# Patient Record
Sex: Male | Born: 1945 | Race: White | Hispanic: No | Marital: Married | Smoking: Current every day smoker
Health system: Southern US, Community
[De-identification: ages and names within clinical notes are randomized; demographics above are authoritative.]

## PROBLEM LIST (undated history)

## (undated) DIAGNOSIS — I10 Essential (primary) hypertension: Secondary | ICD-10-CM

## (undated) DIAGNOSIS — I2510 Atherosclerotic heart disease of native coronary artery without angina pectoris: Secondary | ICD-10-CM

## (undated) DIAGNOSIS — F419 Anxiety disorder, unspecified: Secondary | ICD-10-CM

## (undated) DIAGNOSIS — C329 Malignant neoplasm of larynx, unspecified: Secondary | ICD-10-CM

## (undated) DIAGNOSIS — F32A Depression, unspecified: Secondary | ICD-10-CM

## (undated) DIAGNOSIS — J45909 Unspecified asthma, uncomplicated: Secondary | ICD-10-CM

## (undated) DIAGNOSIS — F329 Major depressive disorder, single episode, unspecified: Secondary | ICD-10-CM

## (undated) DIAGNOSIS — I639 Cerebral infarction, unspecified: Secondary | ICD-10-CM

## (undated) DIAGNOSIS — C300 Malignant neoplasm of nasal cavity: Secondary | ICD-10-CM

## (undated) DIAGNOSIS — M1990 Unspecified osteoarthritis, unspecified site: Secondary | ICD-10-CM

## (undated) DIAGNOSIS — Z85528 Personal history of other malignant neoplasm of kidney: Secondary | ICD-10-CM

## (undated) DIAGNOSIS — R0600 Dyspnea, unspecified: Secondary | ICD-10-CM

## (undated) HISTORY — DX: Personal history of other malignant neoplasm of kidney: Z85.528

## (undated) HISTORY — PX: EYE SURGERY: SHX253

## (undated) HISTORY — PX: CORONARY ANGIOPLASTY: SHX604

## (undated) HISTORY — PX: KIDNEY SURGERY: SHX687

## (undated) HISTORY — DX: Essential (primary) hypertension: I10

## (undated) HISTORY — DX: Malignant neoplasm of nasal cavity: C30.0

## (undated) HISTORY — PX: OTHER SURGICAL HISTORY: SHX169

## (undated) HISTORY — PX: CARDIAC CATHETERIZATION: SHX172

## (undated) HISTORY — DX: Malignant neoplasm of larynx, unspecified: C32.9

## (undated) HISTORY — DX: Unspecified osteoarthritis, unspecified site: M19.90

---

## 2016-03-24 ENCOUNTER — Encounter: Payer: Self-pay | Admitting: Surgery

## 2016-03-31 ENCOUNTER — Encounter: Payer: Self-pay | Admitting: Surgery

## 2016-03-31 ENCOUNTER — Other Ambulatory Visit: Payer: Self-pay | Admitting: *Deleted

## 2016-03-31 ENCOUNTER — Encounter: Payer: Self-pay | Admitting: *Deleted

## 2016-03-31 ENCOUNTER — Institutional Professional Consult (permissible substitution) (INDEPENDENT_AMBULATORY_CARE_PROVIDER_SITE_OTHER): Payer: Medicare Other | Admitting: Surgery

## 2016-03-31 VITALS — BP 140/96 | HR 65 | Resp 18 | Ht 74.0 in

## 2016-03-31 DIAGNOSIS — R918 Other nonspecific abnormal finding of lung field: Secondary | ICD-10-CM

## 2016-04-01 ENCOUNTER — Encounter: Payer: Self-pay | Admitting: *Deleted

## 2016-04-01 ENCOUNTER — Other Ambulatory Visit: Payer: Self-pay

## 2016-04-01 ENCOUNTER — Encounter: Payer: Self-pay | Admitting: Surgery

## 2016-04-01 NOTE — Progress Notes (Signed)
Cardiothoracic Surgery Consultation  PCP is Claudia Pollock, MD Referring Provider is Gardiner Rhyme, MD  Chief Complaint  Patient presents with  . 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 He was evaluated by Dr. Novella Rob from [facility] in [place] in early August for complaints of exertional shortness of breath, palpitations and dizziness. A stress test was abnormal and plans were apparently made for cardiac cath which has not been done.  The stress test reportedly showed inferoapical ischemia and reduced EF.He had a chest x-ray as part of his workup  showing a LUL lung mass. A CT scan performed at [facility] in August which showed a 3 cm tumor in the apical portion of the LUL adjacent to the mediastinum and left subclavian artery that was suspicious for bronchogenic carcinoma. There was also an enlarged pre-vascular lymph node and fullness in the AP window, although no contrast was given to highlight the vasculature. He had a PET scan done in [place] on 01/29/2016 which showed the 25 x 28 mm LUL mass to be hypermetabolic with an SUV of 40.9. The enlarged prevascular ( SUV 5.9)  and AP window ( SUV 7.2) lymph nodes were hypermetabolic. There was also hypermetabolic activity in the left ear and a hypermetabolic soft tissue nodule or lymph node within the left parotid gland. 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  He underwent bronchoscopy and biopsy by Dr. Alcide Clever that was inconclusive.   The patient still smokes about half a pack of cigarettes daily. He lives at home with his wife who is here today. He reports  exertional dyspnea. He has chronic mucous production and cough since his laryngeal radiation. He denies hemoptysis. He has had reduced appetite and weight loss.   PMH:   Hypertension  Thyroid disease  Stroke    PSH:  Abdominal aortic aneurysm diagnosed in 2011 and followed when he lived at the [facility].  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  Social History Social History  Substance Use Topics  . Smoking status: Current Every Day Smoker    Packs/day: 0.50    Years: 30.00    Types: Cigarettes  . Smokeless tobacco: Never Used  . Alcohol use Yes     Comment: OCCASIONAL  [place] veteran with agent orange exposure  Current Outpatient Prescriptions  Medication Sig Dispense Refill  . amLODipine (NORVASC) 5 MG tablet Take 5 mg by mouth daily.    Marland Kitchen aspirin EC 81 MG tablet Take 81 mg by mouth daily.    . bisoprolol (ZEBETA) 5 MG tablet Take 5 mg by mouth daily.    . Fluticasone Furoate-Vilanterol (BREO ELLIPTA IN) Inhale into the lungs.     No current facility-administered medications for this visit.     Not on File  Review of Systems  Constitutional: Positive for appetite change and unexpected weight  change. Negative for chills, fatigue and fever.  HENT: Positive for voice change.        Since treatment of his laryngeal cancer.  Eyes: Negative.   Respiratory: Positive for cough and shortness of breath.   Cardiovascular: Positive for palpitations. Negative for chest pain and leg swelling.  Gastrointestinal: Negative.   Endocrine: Negative.   Genitourinary: Negative.   Skin: Negative.   Allergic/Immunologic: Negative.   Neurological: Positive for dizziness.  Hematological: Negative.   Psychiatric/Behavioral: Negative.     BP (!) 140/96 (BP  Location: Right Arm, Patient Position: Sitting, Cuff Size: Large)   Pulse 65   Resp 18   Ht '6\' 2"'$  (1.88 m)   SpO2 92% Comment: ON RA Physical Exam  Constitutional: He is oriented to person, place, and time.  Chronically ill-appearing gentleman in no distress  Hoarse voice that he says is chronic  HENT:  Head: Normocephalic and atraumatic.  Mouth/Throat: Oropharynx is clear and moist.  Edentulous  Deformed left ear from prior resection. The left external ear is firm and thickened and likely has recurrent squamous cell cancer.  Eyes: EOM are normal. Pupils are equal, round, and reactive to light.  Neck: Normal range of motion. Neck supple. No JVD present. No thyromegaly present.  Cardiovascular: Normal rate, regular rhythm, normal heart sounds and intact distal pulses.   No murmur heard. Pulmonary/Chest: Effort normal. No respiratory distress.  Bilateral rhonchi  Abdominal: Soft. Bowel sounds are normal. He exhibits no distension and no mass. There is no tenderness.  Musculoskeletal: Normal range of motion. He exhibits no edema or tenderness.  Lymphadenopathy:    He has no cervical adenopathy.  Neurological: He is alert and oriented to person, place, and time. He has normal strength. No cranial nerve deficit or sensory deficit.  Skin: Skin is warm and dry.  Psychiatric: He has a normal mood and affect.     Diagnostic Tests:  I have personally reviewed and interpreted his CT scan from [facility] on PACS and his PET scan from [facility] on disc. Findings are as noted above. I reviewed the images with him and his wife and answered their questions.  Impression:  This 70 year old smoker has had multiple cancers in the past and now has a hypermetabolic 2.8 cm mass in the apical LUL consistent with bronchogenic carcinoma. He also has hypermetabolic mediastinal lymph nodes in the prevascular and AP window regions. He also most likely has recurrent squamous cell cancer of the left  external ear and a hypermetabolic soft tissue nodule in the left parotid gland. He has a 7 cm infrarenal AAA. His stress test done in August for exertional shortness of breath and palpitations with dizziness was abnormal and a cath was planned for further workup but delayed due to the finding of this lung mass. His PFT's showed a mild obstructive defect but severe diffusion defect. He has been losing weight with poor appetite. I don't think he is a candidate for surgical resection of the lung mass since he most likely has stage 3A disease and has multiple other comorbid conditions as noted above that would increase his risk of surgery. There is no survival benefit for surgical resection of stage 3A disease. The goal should be obtaining a tissue diagnosis and then referral for consideration of chemo and XRT. I think a CT guided needle biopsy of the LUL lesion may be the best way to get a diagnosis. The location of this lesion would make ENB difficult and lower yield.  The prevascular and AP window lymphadenopathy are not accessible by mediastinoscopy and the AP window may not be accessible with EBUS.   He also has a large AAA which will require evaluation by general vascular surgery for endovascular repair although the presence of stage 3A lung cancer complicates that decision. He may also have significant coronary artery disease that has not been completely evaluated yet.    Plan:  I will have him evaluated by interventional radiology for consideration of a CT-guided biopsy of the LUL lung mass and then will see him back to discuss results and make further plans. I have discussed my impression and recommendations/plan in detail with the patient and his wife and all of their questions have been answered. Gaye Pollack, MD [facility] [phone]

## 2016-04-02 ENCOUNTER — Encounter: Payer: Self-pay | Admitting: Surgery

## 2016-04-02 NOTE — Progress Notes (Signed)
IR sent me a staff message and did not feel that there was a safe window for CT guided needle biopsy of the LUL lung mass since it is medial and just superior to the aorta, adjacent to the LSCA. I reviewed the CT with one of my partners and feel that ENB may be the next best way to biopsy this lung lesion although it is in a difficult location and ENB may only have a 60% chance of getting a diagnosis. The AP window lymphadenopathy may be accessible by EBUS biopsy. We will get a Super Dimension CT of the chest and have him return to see Dr. Roxan Hockey to evaluate for ENB, possible EBUS. I discussed this by phone with the patient's wife and she is in agreement.

## 2016-04-05 ENCOUNTER — Other Ambulatory Visit: Payer: Self-pay | Admitting: *Deleted

## 2016-04-05 DIAGNOSIS — R918 Other nonspecific abnormal finding of lung field: Secondary | ICD-10-CM

## 2016-04-09 ENCOUNTER — Ambulatory Visit
Admission: RE | Admit: 2016-04-09 | Discharge: 2016-04-09 | Disposition: A | Discharge status: Home / Self Care | Payer: Medicare Other | Source: Ambulatory Visit | Attending: Surgery | Admitting: Surgery

## 2016-04-09 ENCOUNTER — Encounter: Payer: Self-pay | Admitting: Thoracic Surgery (Cardiothoracic Vascular Surgery)

## 2016-04-09 ENCOUNTER — Other Ambulatory Visit: Payer: Self-pay

## 2016-04-09 ENCOUNTER — Institutional Professional Consult (permissible substitution) (INDEPENDENT_AMBULATORY_CARE_PROVIDER_SITE_OTHER): Payer: Medicare Other | Admitting: Thoracic Surgery (Cardiothoracic Vascular Surgery)

## 2016-04-09 VITALS — BP 159/95 | HR 78 | Resp 20 | Ht 74.0 in | Wt 176.0 lb

## 2016-04-09 DIAGNOSIS — R911 Solitary pulmonary nodule: Secondary | ICD-10-CM

## 2016-04-09 DIAGNOSIS — R918 Other nonspecific abnormal finding of lung field: Secondary | ICD-10-CM

## 2016-04-09 DIAGNOSIS — R599 Enlarged lymph nodes, unspecified: Secondary | ICD-10-CM

## 2016-04-09 DIAGNOSIS — R591 Generalized enlarged lymph nodes: Secondary | ICD-10-CM

## 2016-04-09 NOTE — Progress Notes (Signed)
Dr. Kalman Shan reviewed Dr. Leonarda Salon progress note from today; MD advised that it is okay for pt to continue Aspirin and hold on DOS.

## 2016-04-09 NOTE — Progress Notes (Signed)
Pre-op instructions given only; please complete pt assessment on DOS. Pt made aware to stop taking vitamins, fish oil and herbal medications. Do not take any NSAIDs ie: Ibuprofen, Advil, Naproxen, BC and Goody Powder. Pt verbalized understanding of all pre-op instructions.

## 2016-04-09 NOTE — Progress Notes (Signed)
PCP is Claudia Pollock, MD Referring Provider is Gaye Pollack, MD  Chief Complaint  Patient presents with  . 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   With complaints of exertional shortness of breath, palpitations, and dizziness. He had an abnormal stress test and plans are being made for cardiac catheterization. He had either a chest x-ray or cardiac CT, it is unclear from the chart. That showed a 3 cm left upper lobe mass with likely mediastinal invasion suspicious for bronchogenic carcinoma. A PET CT was done in [place] which showed a 25 x 28 mm left upper lobe mass which was hypermetabolic with an SUV of 35.5. There was an AP window lymph node with SUV of 7.2. In the left hilar node with an SUV of 11.6. There also was activity in the left ear and left parotid gland. He also was noted to have a 7 cm infrarenal abdominal aneurysm and left common iliac artery aneurysm. He underwent bronchoscopy and biopsy by Dr. Alcide Clever which was inconclusive.  Dr. Cyndia Bent saw the patient and referred for CT-guided biopsy, but interventional radiology would not do the procedure due to risk. He now returns to discuss the possibility of navigational bronchoscopy.  Past Medical History:  Diagnosis Date  . Arthritis   . 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 Hypertension   . Laryngeal cancer ([place])   . Personal history of kidney cancer     Past Surgical History:  Procedure Laterality Date  . ankle surgery    . feet surgery    . s/p chemo/radiation 2013      Family History  Problem Relation Age of Onset  . Cancer Mother     Social History Social History  Substance Use Topics  . Smoking status: Current Every Day Smoker    Packs/day: 0.50     Years: 30.00    Types: Cigarettes  . Smokeless tobacco: Never Used  . Alcohol use Yes     Comment: OCCASIONAL    Current Outpatient Prescriptions  Medication Sig Dispense Refill  . amLODipine (NORVASC) 5 MG tablet Take 5 mg by mouth daily.    Marland Kitchen aspirin EC 81 MG tablet Take 81 mg by mouth daily.    . bisoprolol (ZEBETA) 5 MG tablet Take 5 mg by mouth daily.    Marland Kitchen buPROPion (WELLBUTRIN XL) 150 MG 24 hr tablet Take 150 mg by mouth daily.    . fluticasone (FLONASE) 50 MCG/ACT nasal spray Place 1 spray into both nostrils daily.    . Fluticasone Furoate-Vilanterol (BREO ELLIPTA IN) Inhale into the lungs.    . latanoprost (XALATAN) 0.005 % ophthalmic solution 1 drop at bedtime.    . Levothyroxine Sodium (TIROSINT) 137 MCG CAPS Take 1 capsule by mouth daily before breakfast.    . olmesartan-hydrochlorothiazide (BENICAR HCT) 40-25 MG tablet Take 1 tablet by mouth daily.    . pantoprazole (PROTONIX) 40 MG tablet Take 40 mg by mouth daily.    Marland Kitchen tiZANidine (ZANAFLEX) 4 MG tablet Take 4 mg by mouth every 8 (eight) hours as needed for muscle spasms.     No current facility-administered medications for this visit.     Allergies  Allergen Reactions  . Coconut Oil Shortness Of Breath  Review of Systems  Constitutional: Positive for fatigue and unexpected weight change (15 pound weight loss last 3 months). Negative for activity change.  HENT: Positive for voice change (Hoarse since radiation for laryngeal cancer).   Eyes: Negative for visual disturbance.  Respiratory: Positive for cough, shortness of breath and wheezing.   Cardiovascular: Negative for chest pain and leg swelling.  Gastrointestinal: Negative for abdominal pain and blood in stool.  Genitourinary: Negative for dysuria and hematuria.  Musculoskeletal: Positive for arthralgias and joint swelling.  Neurological: Positive for dizziness and speech difficulty.       History of stroke  Hematological: Negative for adenopathy.  Bruises/bleeds easily.  All other systems reviewed and are negative.   BP (!) 159/95 (BP Location: Left Arm, Cuff Size: Normal)   Pulse 78   Resp 20   Ht '6\' 2"'$  (1.88 m)   Wt 176 lb (79.8 kg)   SpO2 98% Comment: RA  BMI 22.60 kg/m  Physical Exam  Constitutional: He is oriented to person, place, and time. No distress.  70 year old man appears older than stated age  HENT:  Head: Normocephalic and atraumatic.  Mouth/Throat: No oropharyngeal exudate.  Hoarse voice, deformity left ear  Eyes: Conjunctivae and EOM are normal. No scleral icterus.  Neck:  Radiation changes to skin. Well-healed surgical scar  Cardiovascular: Normal rate, regular rhythm and normal heart sounds.   No murmur heard. Pulmonary/Chest: Effort normal and breath sounds normal. No respiratory distress. He has no wheezes. He has no rales.  Abdominal: Soft. He exhibits no distension. There is no tenderness.  Musculoskeletal: Normal range of motion. He exhibits no edema.  Lymphadenopathy:    He has no cervical adenopathy.  Neurological: He is alert and oriented to person, place, and time. No cranial nerve deficit.  Skin: Skin is warm and dry.  Vitals reviewed.    Diagnostic Tests: CT CHEST WITHOUT CONTRAST  TECHNIQUE: Multidetector CT imaging of the chest was performed using thin slice collimation for electromagnetic bronchoscopy planning purposes, without intravenous contrast.  COMPARISON:  CT scan 01/22/2016  FINDINGS: Chest wall: No chest wall mass, supraclavicular or axillary lymphadenopathy. Mild bilateral gynecomastia.  Cardiovascular: The heart is normal in size. Stable tortuosity, ectasia and calcification of the thoracic aorta. Stable fusiform aneurysmal dilatation of the ascending aorta measuring a maximum of 4.3 cm on image number 80 at the level of the right main pulmonary artery. There is also fusiform enlargement of the descending thoracic aorta with maximum measurement of 3.8 cm on  image number 85. Stable dense 3 vessel coronary artery calcifications.  The pulmonary arteries are slightly enlarged which may suggest pulmonary hypertension.  Mediastinum/Nodes: Small scattered mediastinal and hilar lymph nodes but no mass or overt adenopathy. The prevascular lymph node on image number 62 measures 11 mm and is stable. The esophagus is grossly normal.  Lungs/Pleura: Slight interval enlargement of the left apical lung mass. It measures 33 x 30 mm on image number 36 and previously measured 26 x 25 mm. Stable underlying emphysematous changes. No worrisome pulmonary nodules. No infiltrates or effusions.  Upper Abdomen: Stable atherosclerotic calcifications involving the abdominal aorta and branch vessels. No findings to suggest hepatic or adrenal gland metastasis.  Musculoskeletal: No significant bony findings. No evidence of osseous metastatic disease.  IMPRESSION: 1. Slight interval enlargement of the left apical lung mass now measuring 33 x 30 mm. 2. Stable small scattered mediastinal and hilar lymph nodes but no overt adenopathy. 3. Stable emphysematous changes. No acute pulmonary findings or worrisome  pulmonary nodules. 4. Stable fusiform aneurysmal dilatation of the ascending and descending thoracic aorta. Recommend annual imaging followup by CTA or MRA. This recommendation follows 2010 ACCF/AHA/AATS/ACR/ASA/SCA/SCAI/SIR/STS/SVM Guidelines for the Diagnosis and Management of Patients with Thoracic Aortic Disease. Circulation. 2010; 121: D782-U235   Electronically Signed   By: Marijo Sanes M.D.   On: 04/09/2016 15:56  I personally reviewed the CT chest and his outside PET images. I concur with the findings noted above.  The PET shows a hypermetabolic lung mass and hypermetabolic AP window and left hilar nodes  Impression: 70 year old man with multiple life-threatening medical problems. He hasn't talked complicated history with previous  squamous cell carcinoma of the ear with a likely recurrence. He also has laryngeal cancer treated with chemotherapy and radiation in the past. He has chronic hoarseness. He is now been found to have significant coronary artery disease although it has not been completely evaluated. He has a newly discovered 7 cm infrarenal abdominal aortic aneurysm. In addition to that he has a left upper lobe mass with AP window and hilar adenopathy. This is suspicious for stage IIIa non-small cell lung cancer. Unfortunately initial attempts at biopsy were nondiagnostic.  He had a super dimension CT scan done today which showed the left apical mass is increased in size.  This is a very difficult tumor to access it very medial in the upper lobe adjacent to the mediastinum and its very distal. I think the nodule possibly can be accessed with a navigational bronchoscopy, but there certainly is no guarantee that we'll be able to make a definitive diagnosis. I wouldn't that the odds of a diagnostic study around 60-70%. In addition the left hilar node that runs along the superior aspect of the left mainstem bronchus may be visible and accessible by endobronchial ultrasound although again I would put that at no higher than the 60-70% chance. Given that, navigational bronchoscopy and endobronchial ultrasound would  avoid an operation. I think that is important, since he has multiple other serious medical problems need to be addressed including a 7 cm aneurysm. Therefore, I think is reasonable to try ENB and EBUS as the next step.  I discussed the general nature of the procedure with Mr. and Mrs. Kilgour. They understand this is an endoscopic procedure, but would be done in the operating room under general anesthesia. They understand that no guarantee can be given that we will be able to make a diagnosis. I informed him of the indications, risks, benefits, and alternatives. They understand the risks of bleeding, pneumothorax, MI,  DVT, PE, death, as well as possibility of other unforeseeable complications.  He accepts the risks and wishes to proceed.  Plan: Navigational bronchoscopy and endobronchial ultrasound on Monday, 04/12/2016.  [name], MD [facility] [phone]

## 2016-04-12 ENCOUNTER — Ambulatory Visit (HOSPITAL_COMMUNITY): Payer: Medicare Other

## 2016-04-12 ENCOUNTER — Encounter (HOSPITAL_COMMUNITY)
Admission: RE | Disposition: A | Discharge status: Home / Self Care | Payer: Self-pay | Source: Ambulatory Visit | Attending: Thoracic Surgery (Cardiothoracic Vascular Surgery)

## 2016-04-12 ENCOUNTER — Encounter (HOSPITAL_COMMUNITY): Payer: Self-pay | Admitting: Anesthesiology

## 2016-04-12 ENCOUNTER — Ambulatory Visit (HOSPITAL_COMMUNITY): Payer: Medicare Other | Admitting: Certified Registered"

## 2016-04-12 ENCOUNTER — Ambulatory Visit (HOSPITAL_COMMUNITY)
Admission: RE | Admit: 2016-04-12 | Discharge: 2016-04-12 | Disposition: A | Discharge status: Home / Self Care | Payer: Medicare Other | Source: Ambulatory Visit | Attending: Thoracic Surgery (Cardiothoracic Vascular Surgery) | Admitting: Thoracic Surgery (Cardiothoracic Vascular Surgery)

## 2016-04-12 DIAGNOSIS — Z91048 Other nonmedicinal substance allergy status: Secondary | ICD-10-CM | POA: Insufficient documentation

## 2016-04-12 DIAGNOSIS — F17210 Nicotine dependence, cigarettes, uncomplicated: Secondary | ICD-10-CM | POA: Diagnosis not present

## 2016-04-12 DIAGNOSIS — E039 Hypothyroidism, unspecified: Secondary | ICD-10-CM | POA: Insufficient documentation

## 2016-04-12 DIAGNOSIS — R591 Generalized enlarged lymph nodes: Secondary | ICD-10-CM

## 2016-04-12 DIAGNOSIS — Z9889 Other specified postprocedural states: Secondary | ICD-10-CM

## 2016-04-12 DIAGNOSIS — I10 Essential (primary) hypertension: Secondary | ICD-10-CM | POA: Insufficient documentation

## 2016-04-12 DIAGNOSIS — C3412 Malignant neoplasm of upper lobe, left bronchus or lung: Secondary | ICD-10-CM | POA: Insufficient documentation

## 2016-04-12 DIAGNOSIS — R222 Localized swelling, mass and lump, trunk: Secondary | ICD-10-CM

## 2016-04-12 DIAGNOSIS — R918 Other nonspecific abnormal finding of lung field: Secondary | ICD-10-CM | POA: Diagnosis present

## 2016-04-12 DIAGNOSIS — Z01811 Encounter for preprocedural respiratory examination: Secondary | ICD-10-CM

## 2016-04-12 DIAGNOSIS — Z7982 Long term (current) use of aspirin: Secondary | ICD-10-CM | POA: Diagnosis not present

## 2016-04-12 DIAGNOSIS — R911 Solitary pulmonary nodule: Secondary | ICD-10-CM

## 2016-04-12 DIAGNOSIS — K219 Gastro-esophageal reflux disease without esophagitis: Secondary | ICD-10-CM | POA: Insufficient documentation

## 2016-04-12 DIAGNOSIS — I2510 Atherosclerotic heart disease of native coronary artery without angina pectoris: Secondary | ICD-10-CM | POA: Insufficient documentation

## 2016-04-12 DIAGNOSIS — M1990 Unspecified osteoarthritis, unspecified site: Secondary | ICD-10-CM | POA: Diagnosis not present

## 2016-04-12 DIAGNOSIS — R599 Enlarged lymph nodes, unspecified: Secondary | ICD-10-CM

## 2016-04-12 DIAGNOSIS — R590 Localized enlarged lymph nodes: Secondary | ICD-10-CM | POA: Diagnosis not present

## 2016-04-12 HISTORY — PX: VIDEO BRONCHOSCOPY WITH ENDOBRONCHIAL ULTRASOUND: SHX6177

## 2016-04-12 HISTORY — PX: VIDEO BRONCHOSCOPY WITH ENDOBRONCHIAL NAVIGATION: SHX6175

## 2016-04-12 LAB — CBC
HEMATOCRIT: 36.3 % — AB (ref 39.0–52.0)
HEMOGLOBIN: 11.5 g/dL — AB (ref 13.0–17.0)
MCH: 28.8 pg (ref 26.0–34.0)
MCHC: 31.7 g/dL (ref 30.0–36.0)
MCV: 91 fL (ref 78.0–100.0)
Platelets: 262 10*3/uL (ref 150–400)
RBC: 3.99 MIL/uL — AB (ref 4.22–5.81)
RDW: 16.5 % — ABNORMAL HIGH (ref 11.5–15.5)
WBC: 5.5 10*3/uL (ref 4.0–10.5)

## 2016-04-12 LAB — COMPREHENSIVE METABOLIC PANEL
ALBUMIN: 3 g/dL — AB (ref 3.5–5.0)
ALK PHOS: 84 U/L (ref 38–126)
ALT: 10 U/L — AB (ref 17–63)
AST: 15 U/L (ref 15–41)
Anion gap: 9 (ref 5–15)
BILIRUBIN TOTAL: 0.5 mg/dL (ref 0.3–1.2)
BUN: 22 mg/dL — AB (ref 6–20)
CALCIUM: 8.5 mg/dL — AB (ref 8.9–10.3)
CO2: 25 mmol/L (ref 22–32)
CREATININE: 1.38 mg/dL — AB (ref 0.61–1.24)
Chloride: 102 mmol/L (ref 101–111)
GFR calc Af Amer: 59 mL/min — ABNORMAL LOW (ref >60)
GFR calc non Af Amer: 51 mL/min — ABNORMAL LOW (ref >60)
GLUCOSE: 128 mg/dL — AB (ref 65–99)
Potassium: 3.9 mmol/L (ref 3.5–5.1)
SODIUM: 136 mmol/L (ref 135–145)
TOTAL PROTEIN: 6.2 g/dL — AB (ref 6.5–8.1)

## 2016-04-12 LAB — APTT: APTT: 39 s — AB (ref 24–36)

## 2016-04-12 LAB — PROTIME-INR
INR: 1.13
Prothrombin Time: 14.6 seconds (ref 11.4–15.2)

## 2016-04-12 SURGERY — VIDEO BRONCHOSCOPY WITH ENDOBRONCHIAL NAVIGATION
Anesthesia: General | Site: Chest

## 2016-04-12 MED ORDER — EPHEDRINE 5 MG/ML INJ
INTRAVENOUS | Status: AC
  Filled 2016-04-12: qty 10

## 2016-04-12 MED ORDER — LIDOCAINE 2% (20 MG/ML) 5 ML SYRINGE
INTRAMUSCULAR | Status: AC
  Filled 2016-04-12: qty 10

## 2016-04-12 MED ORDER — ONDANSETRON HCL 4 MG/2ML IJ SOLN
INTRAMUSCULAR | Status: AC
  Filled 2016-04-12: qty 2

## 2016-04-12 MED ORDER — DEXAMETHASONE SODIUM PHOSPHATE 10 MG/ML IJ SOLN
INTRAMUSCULAR | Status: DC | PRN
  Administered 2016-04-12: 10 mg via INTRAVENOUS

## 2016-04-12 MED ORDER — SUGAMMADEX SODIUM 200 MG/2ML IV SOLN
INTRAVENOUS | Status: DC | PRN
Start: 2016-04-12 — End: 2016-04-12
  Administered 2016-04-12: 200 mg via INTRAVENOUS

## 2016-04-12 MED ORDER — FENTANYL CITRATE (PF) 100 MCG/2ML IJ SOLN
INTRAMUSCULAR | Status: AC
  Filled 2016-04-12: qty 2

## 2016-04-12 MED ORDER — PHENYLEPHRINE HCL 10 MG/ML IJ SOLN
INTRAVENOUS | Status: DC | PRN
  Administered 2016-04-12: 50 ug/min via INTRAVENOUS

## 2016-04-12 MED ORDER — LIDOCAINE HCL (CARDIAC) 20 MG/ML IV SOLN
INTRAVENOUS | Status: DC | PRN
  Administered 2016-04-12: 50 mg via INTRAVENOUS

## 2016-04-12 MED ORDER — PHENYLEPHRINE HCL 10 MG/ML IJ SOLN
INTRAMUSCULAR | Status: DC | PRN
  Administered 2016-04-12 (×3): 80 ug via INTRAVENOUS

## 2016-04-12 MED ORDER — ROCURONIUM BROMIDE 100 MG/10ML IV SOLN
INTRAVENOUS | Status: DC | PRN
  Administered 2016-04-12: 40 mg via INTRAVENOUS

## 2016-04-12 MED ORDER — ROCURONIUM BROMIDE 10 MG/ML (PF) SYRINGE
PREFILLED_SYRINGE | INTRAVENOUS | Status: AC
  Filled 2016-04-12: qty 10

## 2016-04-12 MED ORDER — 0.9 % SODIUM CHLORIDE (POUR BTL) OPTIME
TOPICAL | Status: DC | PRN
  Administered 2016-04-12: 1000 mL

## 2016-04-12 MED ORDER — EPINEPHRINE PF 1 MG/ML IJ SOLN
INTRAMUSCULAR | Status: AC
  Filled 2016-04-12: qty 2

## 2016-04-12 MED ORDER — EPHEDRINE SULFATE 50 MG/ML IJ SOLN
INTRAMUSCULAR | Status: DC | PRN
  Administered 2016-04-12: 5 mg via INTRAVENOUS
  Administered 2016-04-12: 10 mg via INTRAVENOUS

## 2016-04-12 MED ORDER — DEXAMETHASONE SODIUM PHOSPHATE 10 MG/ML IJ SOLN
INTRAMUSCULAR | Status: AC
  Filled 2016-04-12: qty 1

## 2016-04-12 MED ORDER — MIDAZOLAM HCL 5 MG/5ML IJ SOLN
INTRAMUSCULAR | Status: DC | PRN
  Administered 2016-04-12: 2 mg via INTRAVENOUS

## 2016-04-12 MED ORDER — SUGAMMADEX SODIUM 200 MG/2ML IV SOLN
INTRAVENOUS | Status: AC
  Filled 2016-04-12: qty 2

## 2016-04-12 MED ORDER — PROPOFOL 10 MG/ML IV BOLUS
INTRAVENOUS | Status: AC
  Filled 2016-04-12: qty 20

## 2016-04-12 MED ORDER — ONDANSETRON HCL 4 MG/2ML IJ SOLN
INTRAMUSCULAR | Status: DC | PRN
  Administered 2016-04-12: 4 mg via INTRAVENOUS

## 2016-04-12 MED ORDER — LACTATED RINGERS IV SOLN
INTRAVENOUS | Status: DC | PRN
  Administered 2016-04-12 (×2): via INTRAVENOUS

## 2016-04-12 MED ORDER — PHENYLEPHRINE 40 MCG/ML (10ML) SYRINGE FOR IV PUSH (FOR BLOOD PRESSURE SUPPORT)
PREFILLED_SYRINGE | INTRAVENOUS | Status: AC
  Filled 2016-04-12: qty 10

## 2016-04-12 MED ORDER — SUCCINYLCHOLINE CHLORIDE 20 MG/ML IJ SOLN
INTRAMUSCULAR | Status: DC | PRN
  Administered 2016-04-12: 120 mg via INTRAVENOUS

## 2016-04-12 MED ORDER — PROPOFOL 10 MG/ML IV BOLUS
INTRAVENOUS | Status: DC | PRN
  Administered 2016-04-12: 50 mg via INTRAVENOUS
  Administered 2016-04-12: 150 mg via INTRAVENOUS

## 2016-04-12 MED ORDER — MIDAZOLAM HCL 2 MG/2ML IJ SOLN
INTRAMUSCULAR | Status: AC
  Filled 2016-04-12: qty 2

## 2016-04-12 SURGICAL SUPPLY — 49 items
ADAPTER BRONCH F/PENTAX (ADAPTER) ×4 IMPLANT
BRUSH BIOPSY BRONCH 10 SDTNB (MISCELLANEOUS) ×6 IMPLANT
BRUSH BIOPSY BRONCH 10MM SDTNB (MISCELLANEOUS) ×2
BRUSH CYTOL CELLEBRITY 1.5X140 (MISCELLANEOUS) IMPLANT
BRUSH SUPERTRAX BIOPSY (INSTRUMENTS) IMPLANT
BRUSH SUPERTRAX NDL-TIP CYTO (INSTRUMENTS) ×8 IMPLANT
CANISTER SUCTION 2500CC (MISCELLANEOUS) ×8 IMPLANT
CHANNEL WORK EXTEND EDGE 180 (KITS) IMPLANT
CHANNEL WORK EXTEND EDGE 45 (KITS) IMPLANT
CHANNEL WORK EXTEND EDGE 90 (KITS) ×4 IMPLANT
CONT SPEC 4OZ CLIKSEAL STRL BL (MISCELLANEOUS) ×12 IMPLANT
COTTONBALL LRG STERILE PKG (GAUZE/BANDAGES/DRESSINGS) IMPLANT
COVER DOME SNAP 22 D (MISCELLANEOUS) ×4 IMPLANT
COVER TABLE BACK 60X90 (DRAPES) ×8 IMPLANT
FILTER STRAW FLUID ASPIR (MISCELLANEOUS) IMPLANT
FORCEPS BIOP RJ4 1.8 (CUTTING FORCEPS) IMPLANT
FORCEPS BIOP SUPERTRX PREMAR (INSTRUMENTS) ×4 IMPLANT
GAUZE SPONGE 4X4 12PLY STRL (GAUZE/BANDAGES/DRESSINGS) ×4 IMPLANT
GLOVE SURG SIGNA 7.5 PF LTX (GLOVE) ×8 IMPLANT
GOWN STRL REUS W/ TWL XL LVL3 (GOWN DISPOSABLE) ×4 IMPLANT
GOWN STRL REUS W/TWL XL LVL3 (GOWN DISPOSABLE) ×4
KIT CLEAN ENDO COMPLIANCE (KITS) ×12 IMPLANT
KIT PROCEDURE EDGE 180 (KITS) ×4 IMPLANT
KIT PROCEDURE EDGE 45 (KITS) IMPLANT
KIT PROCEDURE EDGE 90 (KITS) IMPLANT
KIT ROOM TURNOVER OR (KITS) ×8 IMPLANT
MARKER SKIN DUAL TIP RULER LAB (MISCELLANEOUS) ×8 IMPLANT
NEEDLE 22X1 1/2 (OR ONLY) (NEEDLE) IMPLANT
NEEDLE BIOPSY TRANSBRONCH 21G (NEEDLE) IMPLANT
NEEDLE BLUNT 18X1 FOR OR ONLY (NEEDLE) IMPLANT
NEEDLE EBUS SONO TIP PENTAX (NEEDLE) ×4 IMPLANT
NEEDLE SUPERTRX PREMARK BIOPSY (NEEDLE) ×4 IMPLANT
NS IRRIG 1000ML POUR BTL (IV SOLUTION) ×8 IMPLANT
OIL SILICONE PENTAX (PARTS (SERVICE/REPAIRS)) ×8 IMPLANT
PAD ARMBOARD 7.5X6 YLW CONV (MISCELLANEOUS) ×16 IMPLANT
PATCHES PATIENT (LABEL) ×12 IMPLANT
SYR 20CC LL (SYRINGE) ×8 IMPLANT
SYR 20ML ECCENTRIC (SYRINGE) ×8 IMPLANT
SYR 30ML LL (SYRINGE) ×4 IMPLANT
SYR 50ML LL SCALE MARK (SYRINGE) ×8 IMPLANT
SYR 5ML LL (SYRINGE) ×8 IMPLANT
SYR 5ML LUER SLIP (SYRINGE) ×4 IMPLANT
SYR CONTROL 10ML LL (SYRINGE) IMPLANT
TOWEL OR 17X24 6PK STRL BLUE (TOWEL DISPOSABLE) ×8 IMPLANT
TRAP SPECIMEN MUCOUS 40CC (MISCELLANEOUS) ×8 IMPLANT
TUBE CONNECTING 20'X1/4 (TUBING) ×3
TUBE CONNECTING 20X1/4 (TUBING) ×9 IMPLANT
UNDERPAD 30X30 (UNDERPADS AND DIAPERS) ×4 IMPLANT
WATER STERILE IRR 1000ML POUR (IV SOLUTION) ×8 IMPLANT

## 2016-04-12 NOTE — Discharge Instructions (Addendum)
Do not drive or engage in heavy physical activity for 24 hours  You may resume normal activities tomorrow  You may cough up small amounts of blood over the next few days. Call if you cough up more than 2 tablespoons of blood  Call [phone] if you develop chest pain, shortness of breath, or fever > 101F  You may use acetaminophen (Tylenol) if needed for discomfort over the next few days.  Follow up with Dr. Alcide Clever

## 2016-04-12 NOTE — Brief Op Note (Signed)
04/12/2016  10:32 AM  PATIENT:  George Macdonald  70 y.o. male  PRE-OPERATIVE DIAGNOSIS:  LEFT UPPER LOBE NODULE, ADENOPATHY  POST-OPERATIVE DIAGNOSIS:  POORLY DIFFERENTIATED CARCINOMA, Clinical stage IIIA  PROCEDURE:  Procedure(s): VIDEO BRONCHOSCOPY WITH ENDOBRONCHIAL NAVIGATION (N/A) VIDEO BRONCHOSCOPY WITH ENDOBRONCHIAL ULTRASOUND (N/A)  SURGEON:  Surgeon(s) and Role:    * [name], MD - Primary  PHYSICIAN ASSISTANT:   ASSISTANTS: none   ANESTHESIA:   general  EBL:  Total I/O In: 1300 [I.V.:1300] Out: 10 [Blood:10]  BLOOD ADMINISTERED:none  DRAINS: none   LOCAL MEDICATIONS USED:  NONE  SPECIMEN:  Source of Specimen:  Mediastinal nodes, LUL nodule  DISPOSITION OF SPECIMEN:  PATHOLOGY  PLAN OF CARE: Discharge to home after PACU  PATIENT DISPOSITION:  PACU - hemodynamically stable.   Delay start of Pharmacological VTE agent (>24hrs) due to surgical blood loss or risk of bleeding: not applicable

## 2016-04-12 NOTE — Transfer of Care (Signed)
Immediate Anesthesia Transfer of Care Note  Patient: George Macdonald  Procedure(s) Performed: Procedure(s): VIDEO BRONCHOSCOPY WITH ENDOBRONCHIAL NAVIGATION (N/A) VIDEO BRONCHOSCOPY WITH ENDOBRONCHIAL ULTRASOUND (N/A)  Patient Location: PACU  Anesthesia Type:General  Level of Consciousness: awake and alert   Airway & Oxygen Therapy: Patient Spontanous Breathing and Patient connected to face mask oxygen  Post-op Assessment: Report given to RN, Post -op Vital signs reviewed and stable and Patient moving all extremities  Post vital signs: Reviewed and stable  Last Vitals:  Vitals:   04/12/16 0710  BP: (!) 147/83  Pulse: (!) 52  Resp: 20  Temp: 36.7 C    Last Pain:  Vitals:   04/12/16 0710  TempSrc: Oral         Complications: No apparent anesthesia complications

## 2016-04-12 NOTE — Progress Notes (Signed)
Report given to ronda hunt rn as caregiver 

## 2016-04-12 NOTE — Anesthesia Procedure Notes (Signed)
Procedure Name: Intubation Date/Time: 04/12/2016 8:43 AM Performed by: Myna Bright Pre-anesthesia Checklist: Patient identified, Emergency Drugs available, Suction available and Patient being monitored Patient Re-evaluated:Patient Re-evaluated prior to inductionOxygen Delivery Method: Circle system utilized Preoxygenation: Pre-oxygenation with 100% oxygen Intubation Type: IV induction Ventilation: Unable to mask ventilate Laryngoscope Size: Glidescope and 4 Grade View: Grade I Tube type: Oral Tube size: 8.5 mm Number of attempts: 1 Airway Equipment and Method: Stylet and Video-laryngoscopy Placement Confirmation: ETT inserted through vocal cords under direct vision,  positive ETCO2 and breath sounds checked- equal and bilateral Secured at: 22 cm Tube secured with: Tape Dental Injury: Teeth and Oropharynx as per pre-operative assessment  Comments: Smooth IV induction. Unable to mask ventilate even with oral airway. Anectine used. Glidescope used d/t history of radiation to neck. Grade I with Glide. AOI. VSS.

## 2016-04-12 NOTE — Interval H&P Note (Signed)
History and Physical Interval Note:  04/12/2016 7:21 AM  George Macdonald  has presented today for surgery, with the diagnosis of LEFT UPPER LOBE NODULE, ADENOPATHY  The various methods of treatment have been discussed with the patient and family. After consideration of risks, benefits and other options for treatment, the patient has consented to  Procedure(s): VIDEO BRONCHOSCOPY WITH ENDOBRONCHIAL NAVIGATION (N/A) VIDEO BRONCHOSCOPY WITH ENDOBRONCHIAL ULTRASOUND (N/A) as a surgical intervention .  The patient's history has been reviewed, patient examined, no change in status, stable for surgery.  I have reviewed the patient's chart and labs.  Questions were answered to the patient's satisfaction.     [name]

## 2016-04-12 NOTE — Anesthesia Postprocedure Evaluation (Signed)
Anesthesia Post Note  Patient: George Macdonald  Procedure(s) Performed: Procedure(s) (LRB): VIDEO BRONCHOSCOPY WITH ENDOBRONCHIAL NAVIGATION (N/A) VIDEO BRONCHOSCOPY WITH ENDOBRONCHIAL ULTRASOUND (N/A)  Patient location during evaluation: PACU Anesthesia Type: General Level of consciousness: awake and alert Pain management: pain level controlled Vital Signs Assessment: post-procedure vital signs reviewed and stable Respiratory status: spontaneous breathing, nonlabored ventilation, respiratory function stable and patient connected to nasal cannula oxygen Cardiovascular status: blood pressure returned to baseline and stable Postop Assessment: no signs of nausea or vomiting Anesthetic complications: no    Last Vitals:  Vitals:   04/12/16 1100 04/12/16 1116  BP:  112/79  Pulse: (!) 55 (!) 56  Resp: 12 12  Temp:  36.1 C    Last Pain:  Vitals:   04/12/16 1116  TempSrc:   PainSc: 0-No pain                 Effie Berkshire

## 2016-04-12 NOTE — H&P (View-Only) (Signed)
PCP is Claudia Pollock, MD Referring Provider is Gaye Pollack, MD  Chief Complaint  Patient presents with  . 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   With complaints of exertional shortness of breath, palpitations, and dizziness. He had an abnormal stress test and plans are being made for cardiac catheterization. He had either a chest x-ray or cardiac CT, it is unclear from the chart. That showed a 3 cm left upper lobe mass with likely mediastinal invasion suspicious for bronchogenic carcinoma. A PET CT was done in [place] which showed a 25 x 28 mm left upper lobe mass which was hypermetabolic with an SUV of 22.9. There was an AP window lymph node with SUV of 7.2. In the left hilar node with an SUV of 11.6. There also was activity in the left ear and left parotid gland. He also was noted to have a 7 cm infrarenal abdominal aneurysm and left common iliac artery aneurysm. He underwent bronchoscopy and biopsy by Dr. Alcide Clever which was inconclusive.  Dr. Cyndia Bent saw the patient and referred for CT-guided biopsy, but interventional radiology would not do the procedure due to risk. He now returns to discuss the possibility of navigational bronchoscopy.  Past Medical History:  Diagnosis Date  . Arthritis   . Cancer of nasal cavities ([place])   . Hypertension   . Laryngeal cancer ([place])   . Personal history of kidney cancer     Past Surgical History:  Procedure Laterality Date  . ankle surgery    . feet surgery    . s/p chemo/radiation 2013      Family History  Problem Relation Age of Onset  . Cancer Mother     Social History Social History  Substance Use Topics  . Smoking status: Current Every Day Smoker    Packs/day: 0.50     Years: 30.00    Types: Cigarettes  . Smokeless tobacco: Never Used  . Alcohol use Yes     Comment: OCCASIONAL    Current Outpatient Prescriptions  Medication Sig Dispense Refill  . amLODipine (NORVASC) 5 MG tablet Take 5 mg by mouth daily.    Marland Kitchen aspirin EC 81 MG tablet Take 81 mg by mouth daily.    . bisoprolol (ZEBETA) 5 MG tablet Take 5 mg by mouth daily.    Marland Kitchen buPROPion (WELLBUTRIN XL) 150 MG 24 hr tablet Take 150 mg by mouth daily.    . fluticasone (FLONASE) 50 MCG/ACT nasal spray Place 1 spray into both nostrils daily.    . Fluticasone Furoate-Vilanterol (BREO ELLIPTA IN) Inhale into the lungs.    . latanoprost (XALATAN) 0.005 % ophthalmic solution 1 drop at bedtime.    . Levothyroxine Sodium (TIROSINT) 137 MCG CAPS Take 1 capsule by mouth daily before breakfast.    . olmesartan-hydrochlorothiazide (BENICAR HCT) 40-25 MG tablet Take 1 tablet by mouth daily.    . pantoprazole (PROTONIX) 40 MG tablet Take 40 mg by mouth daily.    Marland Kitchen tiZANidine (ZANAFLEX) 4 MG tablet Take 4 mg by mouth every 8 (eight) hours as needed for muscle spasms.     No current facility-administered medications for this visit.     Allergies  Allergen Reactions  . Coconut Oil Shortness Of Breath  Review of Systems  Constitutional: Positive for fatigue and unexpected weight change (15 pound weight loss last 3 months). Negative for activity change.  HENT: Positive for voice change (Hoarse since radiation for laryngeal cancer).   Eyes: Negative for visual disturbance.  Respiratory: Positive for cough, shortness of breath and wheezing.   Cardiovascular: Negative for chest pain and leg swelling.  Gastrointestinal: Negative for abdominal pain and blood in stool.  Genitourinary: Negative for dysuria and hematuria.  Musculoskeletal: Positive for arthralgias and joint swelling.  Neurological: Positive for dizziness and speech difficulty.       History of stroke  Hematological: Negative for adenopathy.  Bruises/bleeds easily.  All other systems reviewed and are negative.   BP (!) 159/95 (BP Location: Left Arm, Cuff Size: Normal)   Pulse 78   Resp 20   Ht '6\' 2"'$  (1.88 m)   Wt 176 lb (79.8 kg)   SpO2 98% Comment: RA  BMI 22.60 kg/m  Physical Exam  Constitutional: He is oriented to person, place, and time. No distress.  70 year old man appears older than stated age  HENT:  Head: Normocephalic and atraumatic.  Mouth/Throat: No oropharyngeal exudate.  Hoarse voice, deformity left ear  Eyes: Conjunctivae and EOM are normal. No scleral icterus.  Neck:  Radiation changes to skin. Well-healed surgical scar  Cardiovascular: Normal rate, regular rhythm and normal heart sounds.   No murmur heard. Pulmonary/Chest: Effort normal and breath sounds normal. No respiratory distress. He has no wheezes. He has no rales.  Abdominal: Soft. He exhibits no distension. There is no tenderness.  Musculoskeletal: Normal range of motion. He exhibits no edema.  Lymphadenopathy:    He has no cervical adenopathy.  Neurological: He is alert and oriented to person, place, and time. No cranial nerve deficit.  Skin: Skin is warm and dry.  Vitals reviewed.    Diagnostic Tests: CT CHEST WITHOUT CONTRAST  TECHNIQUE: Multidetector CT imaging of the chest was performed using thin slice collimation for electromagnetic bronchoscopy planning purposes, without intravenous contrast.  COMPARISON:  CT scan 01/22/2016  FINDINGS: Chest wall: No chest wall mass, supraclavicular or axillary lymphadenopathy. Mild bilateral gynecomastia.  Cardiovascular: The heart is normal in size. Stable tortuosity, ectasia and calcification of the thoracic aorta. Stable fusiform aneurysmal dilatation of the ascending aorta measuring a maximum of 4.3 cm on image number 80 at the level of the right main pulmonary artery. There is also fusiform enlargement of the descending thoracic aorta with maximum measurement of 3.8 cm on  image number 85. Stable dense 3 vessel coronary artery calcifications.  The pulmonary arteries are slightly enlarged which may suggest pulmonary hypertension.  Mediastinum/Nodes: Small scattered mediastinal and hilar lymph nodes but no mass or overt adenopathy. The prevascular lymph node on image number 62 measures 11 mm and is stable. The esophagus is grossly normal.  Lungs/Pleura: Slight interval enlargement of the left apical lung mass. It measures 33 x 30 mm on image number 36 and previously measured 26 x 25 mm. Stable underlying emphysematous changes. No worrisome pulmonary nodules. No infiltrates or effusions.  Upper Abdomen: Stable atherosclerotic calcifications involving the abdominal aorta and branch vessels. No findings to suggest hepatic or adrenal gland metastasis.  Musculoskeletal: No significant bony findings. No evidence of osseous metastatic disease.  IMPRESSION: 1. Slight interval enlargement of the left apical lung mass now measuring 33 x 30 mm. 2. Stable small scattered mediastinal and hilar lymph nodes but no overt adenopathy. 3. Stable emphysematous changes. No acute pulmonary findings or worrisome  pulmonary nodules. 4. Stable fusiform aneurysmal dilatation of the ascending and descending thoracic aorta. Recommend annual imaging followup by CTA or MRA. This recommendation follows 2010 ACCF/AHA/AATS/ACR/ASA/SCA/SCAI/SIR/STS/SVM Guidelines for the Diagnosis and Management of Patients with Thoracic Aortic Disease. Circulation. 2010; 121: W299-B716   Electronically Signed   By: Marijo Sanes M.D.   On: 04/09/2016 15:56  I personally reviewed the CT chest and his outside PET images. I concur with the findings noted above.  The PET shows a hypermetabolic lung mass and hypermetabolic AP window and left hilar nodes  Impression: 70 year old man with multiple life-threatening medical problems. He hasn't talked complicated history with previous  squamous cell carcinoma of the ear with a likely recurrence. He also has laryngeal cancer treated with chemotherapy and radiation in the past. He has chronic hoarseness. He is now been found to have significant coronary artery disease although it has not been completely evaluated. He has a newly discovered 7 cm infrarenal abdominal aortic aneurysm. In addition to that he has a left upper lobe mass with AP window and hilar adenopathy. This is suspicious for stage IIIa non-small cell lung cancer. Unfortunately initial attempts at biopsy were nondiagnostic.  He had a super dimension CT scan done today which showed the left apical mass is increased in size.  This is a very difficult tumor to access it very medial in the upper lobe adjacent to the mediastinum and its very distal. I think the nodule possibly can be accessed with a navigational bronchoscopy, but there certainly is no guarantee that we'll be able to make a definitive diagnosis. I wouldn't that the odds of a diagnostic study around 60-70%. In addition the left hilar node that runs along the superior aspect of the left mainstem bronchus may be visible and accessible by endobronchial ultrasound although again I would put that at no higher than the 60-70% chance. Given that, navigational bronchoscopy and endobronchial ultrasound would  avoid an operation. I think that is important, since he has multiple other serious medical problems need to be addressed including a 7 cm aneurysm. Therefore, I think is reasonable to try ENB and EBUS as the next step.  I discussed the general nature of the procedure with Mr. and Mrs. Tat. They understand this is an endoscopic procedure, but would be done in the operating room under general anesthesia. They understand that no guarantee can be given that we will be able to make a diagnosis. I informed him of the indications, risks, benefits, and alternatives. They understand the risks of bleeding, pneumothorax, MI,  DVT, PE, death, as well as possibility of other unforeseeable complications.  He accepts the risks and wishes to proceed.  Plan: Navigational bronchoscopy and endobronchial ultrasound on Monday, 04/12/2016.  [name], MD [facility] [phone]

## 2016-04-12 NOTE — Anesthesia Preprocedure Evaluation (Addendum)
Anesthesia Evaluation  Patient identified by MRN, date of birth, ID band Patient awake    Reviewed: Allergy & Precautions, NPO status , Patient's Chart, lab work & pertinent test results, reviewed documented beta blocker date and time   Airway Mallampati: I       Dental  (+) Edentulous Upper, Edentulous Lower   Pulmonary Current Smoker,    breath sounds clear to auscultation       Cardiovascular hypertension, Pt. on medications and Pt. on home beta blockers  Rhythm:Regular Rate:Normal     Neuro/Psych negative neurological ROS  negative psych ROS   GI/Hepatic Neg liver ROS, GERD  Medicated,  Endo/Other  Hypothyroidism   Renal/GU negative Renal ROS  negative genitourinary   Musculoskeletal  (+) Arthritis , Osteoarthritis,    Abdominal   Peds negative pediatric ROS (+)  Hematology negative hematology ROS (+)   Anesthesia Other Findings   Reproductive/Obstetrics negative OB ROS                            Anesthesia Physical Anesthesia Plan  ASA: II  Anesthesia Plan: General   Post-op Pain Management:    Induction: Intravenous  Airway Management Planned: Oral ETT  Additional Equipment:   Intra-op Plan:   Post-operative Plan: Extubation in OR  Informed Consent: I have reviewed the patients History and Physical, chart, labs and discussed the procedure including the risks, benefits and alternatives for the proposed anesthesia with the patient or authorized representative who has indicated his/her understanding and acceptance.   Dental advisory given  Plan Discussed with: CRNA  Anesthesia Plan Comments:         Anesthesia Quick Evaluation

## 2016-04-13 ENCOUNTER — Encounter (HOSPITAL_COMMUNITY): Payer: Self-pay | Admitting: Thoracic Surgery (Cardiothoracic Vascular Surgery)

## 2016-04-13 NOTE — Op Note (Signed)
NAMEWIL, George Macdonald          ACCOUNT NO.:  [id number]  MEDICAL RECORD NO.:  [id number]  LOCATION:  [facility]                         FACILITY:  [facility]  PHYSICIAN:  Revonda Standard. Roxan Hockey, M.D.DATE OF BIRTH:  May 18, 1946  DATE OF PROCEDURE:  04/12/2016 DATE OF DISCHARGE:                              OPERATIVE REPORT   PREOPERATIVE DIAGNOSIS:  Left upper lobe mass with hilar and mediastinal adenopathy.  POSTOPERATIVE DIAGNOSIS:  Poorly differentiated carcinoma, clinical stage IIIA.  PROCEDURE: 1. Electromagnetic navigational bronchoscopy with needle aspirations,     brushings, biopsies, and bronchoalveolar lavage. 2. Endobronchial ultrasound with mediastinal lymph node aspirations.  SURGEON:  Revonda Standard. Roxan Hockey, M.D.  ANESTHESIA:  General.  FINDINGS:  Relatively small mediastinal lymph nodes.  Aspirations were negative.  Navigated within 1.5 cm of the left upper lobe mass.  Needle aspirations and brushings both positive for poorly differentiated carcinoma.  CLINICAL NOTE:  George Macdonald is a 70 year old gentleman with a history of tobacco abuse, laryngeal cancer, multiple other skin cancers who presented with a chief complaint of shortness of breath, palpitations, and dizziness.  Workup showed a 3-cm left upper lobe mass along with AP window and hilar adenopathy.  Bronchoscopy and biopsy were inconclusive.  The patient was referred for further evaluation.  It was recommended that he undergo navigational bronchoscopy and endobronchial ultrasound for diagnostic and staging purposes.  The indications, risks, benefits, and alternatives were discussed in detail with the patient. He understood and accepted the risks and agreed to proceed.  OPERATIVE NOTE:  George Macdonald was brought to the operating room on April 12, 2016.  Anesthesia was induced and he was intubated. Flexible fiberoptic bronchoscopy was performed via the endotracheal tube. It revealed normal endobronchial  anatomy.  There were thick clear secretions.  There were no endobronchial lesions to the level of the subsegmental bronchi.  The bronchoscope was removed.  The endobronchial ultrasound probe was advanced.  Systematic inspection of the paratracheal and peribronchial nodes were performed.  There were no markedly enlarged nodes in the area accessible to the endobronchial ultrasound scope, but nodes were present in the 4L, 7, and 10L locations.  Each of these nodes were aspirated. With each aspiration the needle was advanced into the lymph node, suction was applied, and 10-12 passes were performed with the needle. The specimen then was placed onto slides and any remainder was placed into cytologic preparation fluid.  These specimens were sent for quick prep.  Each node was aspirated twice.  The endobronchial ultrasound probe was removed.  The bronchoscope was reinserted.  The locatable guide for navigation was placed and registration was performed.  There was good correlation of the video and virtual bronchoscopy.  The bronchoscope was then directed to the left upper lobe bronchus, and the locatable guide was advanced into the appropriate left upper lobe segmental bronchus and advanced to within 1.5 cm of the left upper lobe mass.  By this point, the node aspirations had come back nondiagnostic.  Needle aspirations of the left upper lone mass were performed, followed by brushings using both a needle brush and a triple brush.  The specimens were sent for cytology.  Multiple biopsies were taken.  The locatable guide was periodically reinserted to  ensure alignment with the mass and proximity.  All specimens taken from the left upper lobe mass were done with fluoroscopic guidance.  A total of 4.8 minutes of fluoroscopy was used with 102 mGy of radiation.  While awaiting those results, the locatable guide was reinserted.  The catheter was advanced to a slightly different angle on the tumor and  the sampling was repeated.  After taking additional biopsies, bronchoalveolar lavage was performed with 60 mL of normal saline.  There was return of approximately 20 mL.  The left upper lobe bronchus was copiously irrigated with saline until there was no ongoing bleeding.  The bronchoscope was removed.  The brushings and needle aspirations from the left upper lobe mass both showed poorly differentiated carcinoma.  Further characterization will await final pathology.  The patient was extubated in the operating room and taken to the postanesthetic care unit in good condition.     Revonda Standard Roxan Hockey, M.D.     SCH/MEDQ  D:  04/12/2016  T:  04/13/2016  Job:  [id number]

## 2016-04-23 DIAGNOSIS — Z8521 Personal history of malignant neoplasm of larynx: Secondary | ICD-10-CM | POA: Diagnosis not present

## 2016-04-23 DIAGNOSIS — Z8553 Personal history of malignant neoplasm of renal pelvis: Secondary | ICD-10-CM | POA: Diagnosis not present

## 2016-04-23 DIAGNOSIS — I719 Aortic aneurysm of unspecified site, without rupture: Secondary | ICD-10-CM | POA: Diagnosis not present

## 2016-04-23 DIAGNOSIS — R938 Abnormal findings on diagnostic imaging of other specified body structures: Secondary | ICD-10-CM | POA: Diagnosis not present

## 2016-04-23 DIAGNOSIS — C3492 Malignant neoplasm of unspecified part of left bronchus or lung: Secondary | ICD-10-CM | POA: Diagnosis not present

## 2016-04-28 DIAGNOSIS — C3412 Malignant neoplasm of upper lobe, left bronchus or lung: Secondary | ICD-10-CM | POA: Diagnosis not present

## 2016-05-03 ENCOUNTER — Inpatient Hospital Stay (HOSPITAL_COMMUNITY): Admission: RE | Admit: 2016-05-03 | Payer: Medicare Other | Source: Ambulatory Visit

## 2016-05-03 ENCOUNTER — Encounter: Payer: Self-pay | Admitting: Vascular Surgery

## 2016-05-04 ENCOUNTER — Ambulatory Visit (INDEPENDENT_AMBULATORY_CARE_PROVIDER_SITE_OTHER): Payer: Medicare Other | Admitting: Vascular Surgery

## 2016-05-04 ENCOUNTER — Encounter: Payer: Self-pay | Admitting: Vascular Surgery

## 2016-05-04 ENCOUNTER — Encounter (HOSPITAL_COMMUNITY): Payer: Medicare Other

## 2016-05-04 VITALS — BP 106/75 | HR 78 | Temp 97.7°F | Resp 28 | Ht 74.0 in | Wt 174.0 lb

## 2016-05-04 DIAGNOSIS — I714 Abdominal aortic aneurysm, without rupture, unspecified: Secondary | ICD-10-CM

## 2016-05-04 DIAGNOSIS — Z01810 Encounter for preprocedural cardiovascular examination: Secondary | ICD-10-CM

## 2016-05-04 NOTE — Progress Notes (Signed)
Vascular and Vein Specialist of [facility]  Patient name: George Macdonald MRN: [id number] DOB: February 15, 1946 Sex: male  REASON FOR CONSULT: Evaluation of infrarenal 7 cm abdominal aortic aneurysm  HPI: George Macdonald is a 70 y.o. male, who is seen today for discussion of 7 cm aneurysm. He is extremely complex. His aneurysm had been followed in [facility] prior to his moving to [place]. I have obtained ultrasound results beginning in June 2014 and extending through August 2015. His most recent ultrasound in August 2015 revealed a maximal diameter of 4.2 cm he is currently being evaluated for resection of a lung cancer. Does have a history of laryngeal cancer with chemotherapy and radiation therapy and has chronic hoarseness related to this. This treatment was in 2012. Also status post right brain stroke in 2011. He did have a contrast CT during evaluation for lung cancer that revealed probable stenosis in his right internal carotid artery. He underwent duplex of this at [facility] yesterday showing plaque but no evidence of critical stenosis. He has had no recent neurologic changes. He has had cardiac evaluation [place] in the past and had a stress test and had recommendation of cardiac cath. He then had the diagnosis of lung cancer and that catheterization was delayed.  Past Medical History:  Diagnosis Date  . Arthritis   . Cancer of nasal cavities ([place])   . Hypertension   . Laryngeal cancer ([place])   . Personal history of kidney cancer     Family History  Problem Relation Age of Onset  . Cancer Mother     SOCIAL HISTORY: Social History   Social History  . Marital status: Married    Spouse name: N/A  . Number of children: N/A  . Years of education: N/A   Occupational History  . retired    Social History Main Topics  . Smoking status: Current Every Day Smoker    Packs/day: 0.10    Years: 30.00    Types:  Cigarettes  . Smokeless tobacco: Never Used  . Alcohol use Yes     Comment: OCCASIONAL  . Drug use: Unknown  . Sexual activity: Not on file   Other Topics Concern  . Not on file   Social History Narrative  . No narrative on file    Allergies  Allergen Reactions  . Coconut Oil Shortness Of Breath    Current Outpatient Prescriptions  Medication Sig Dispense Refill  . amLODipine (NORVASC) 5 MG tablet Take 5 mg by mouth daily.    Marland Kitchen aspirin EC 81 MG tablet Take 81 mg by mouth daily.    . bisoprolol (ZEBETA) 5 MG tablet Take 5 mg by mouth daily.    Marland Kitchen buPROPion (WELLBUTRIN XL) 150 MG 24 hr tablet Take 150 mg by mouth daily.    Marland Kitchen doxycycline (VIBRA-TABS) 100 MG tablet Take by mouth.    . fluticasone (FLONASE) 50 MCG/ACT nasal spray Place 1 spray into both nostrils daily as needed for allergies.     . Fluticasone-Salmeterol (ADVAIR) 250-50 MCG/DOSE AEPB Inhale 1 puff into the lungs daily.    Marland Kitchen latanoprost (XALATAN) 0.005 % ophthalmic solution Place 1 drop into both eyes at bedtime.     Marland Kitchen levothyroxine (SYNTHROID, LEVOTHROID) 137 MCG tablet Take 137 mcg by mouth daily before breakfast.    . mupirocin cream (BACTROBAN) 2 % Apply topically.    Marland Kitchen olmesartan-hydrochlorothiazide (BENICAR HCT) 40-25 MG tablet Take 1 tablet by mouth daily.    . pantoprazole (PROTONIX) 40  MG tablet Take 40 mg by mouth daily.    Marland Kitchen tiZANidine (ZANAFLEX) 4 MG tablet Take 4 mg by mouth every 8 (eight) hours as needed for muscle spasms.     No current facility-administered medications for this visit.     REVIEW OF SYSTEMS:  '[X]'$  denotes positive finding, '[ ]'$  denotes negative finding Cardiac  Comments:  Chest pain or chest pressure:    Shortness of breath upon exertion: x   Short of breath when lying flat:    Irregular heart rhythm:        Vascular    Pain in calf, thigh, or hip brought on by ambulation:    Pain in feet at night that wakes you up from your sleep:     Blood clot in your veins:    Leg  swelling:         Pulmonary    Oxygen at home:    Productive cough:     Wheezing:         Neurologic    Sudden weakness in arms or legs:     Sudden numbness in arms or legs:     Sudden onset of difficulty speaking or slurred speech:    Temporary loss of vision in one eye:     Problems with dizziness:         Gastrointestinal    Blood in stool:     Vomited blood:         Genitourinary    Burning when urinating:     Blood in urine:        Psychiatric    Major depression:         Hematologic    Bleeding problems:    Problems with blood clotting too easily:        Skin    Rashes or ulcers:        Constitutional    Fever or chills:      PHYSICAL EXAM: Vitals:   05/04/16 1313  BP: 106/75  Pulse: 78  Resp: (!) 28  Temp: 97.7 F (36.5 C)  TempSrc: Oral  SpO2: 98%  Weight: 174 lb (78.9 kg)  Height: '6\' 2"'$  (1.88 m)    GENERAL: The patient is a well-nourished male, in no acute distress. The vital signs are documented above.Does have a very hoarse voice related to his laryngeal cancer CARDIOVASCULAR: 2+ radial 2+ femoral and 2+ popliteal pulses. I do not detect carotid bruits PULMONARY: There is good air exchange  ABDOMEN: Soft and non-tender . Easily palpable nontender abdominal aortic aneurysm MUSCULOSKELETAL: There are no major deformities or cyanosis. NEUROLOGIC: No focal weakness or paresthesias are detected. SKIN: There are no ulcers or rashes noted. PSYCHIATRIC: The patient has a normal affect.  DATA:  I did have his disc from his PET scan. This does show a large infrarenal aneurysm. Was unable to determine exact location of his renal artery takeoff and exact diameter of his aorta based on this pet scan disc. Does appear to have dilatation of his aorta below the level the renal arteries  Reviewed his carotid duplex from [facility] yesterday showing no evidence of critical carotid stenosis with plaque bilaterally  MEDICAL ISSUES: Extremely complex  patient with multiple active issues. He does have a known 3 cm lung cancer with treatment pending  7 cm infrarenal aneurysm which is grown from 4.2 cm by ultrasound in August 2015  Carotid stenosis by CT scan which showed appears to be less significant by ultrasound  with no acute symptoms  History of positive stress test with the recommendation of cardiac catheterization.  I have recommended CT angiogram for further evaluation to determine if he is a stent graft candidate for repair of his 7 cm aneurysm. Apparently the oncologist have the felt that it is more important for him to have a treatment of his large aneurysm prior to initiation of chemotherapy. We will obtain a CT scan also determine if cardiology evaluation is needed. Will discuss this further after his CT scan   Rosetta Posner, MD [facility] Vascular and Vein Specialists of [facility] Tel [phone] Pager [phone]

## 2016-05-05 ENCOUNTER — Other Ambulatory Visit: Payer: Self-pay

## 2016-05-10 ENCOUNTER — Encounter: Payer: Self-pay | Admitting: *Deleted

## 2016-05-10 NOTE — Progress Notes (Signed)
[facility] is set up to see George Macdonald on 05/12/16 with radiation oncology.

## 2016-05-10 NOTE — Progress Notes (Signed)
Oncology Nurse Navigator Documentation  Oncology Nurse Navigator Flowsheets 05/10/2016  Navigator Encounter Type Other/per cancer conference discussion, patient is to received radiation.  I called [place] to see if patient was set up for radiation.  I was able to leave a vm message to call me with an update.   Barriers/Navigation Needs Coordination of Care  Interventions Coordination of Care  Coordination of Care Other  Acuity Level 1  Time Spent with Patient 15

## 2016-05-11 ENCOUNTER — Encounter: Payer: Self-pay | Admitting: Vascular Surgery

## 2016-05-21 ENCOUNTER — Other Ambulatory Visit (HOSPITAL_COMMUNITY)
Admission: RE | Admit: 2016-05-21 | Discharge: 2016-05-21 | Disposition: A | Discharge status: Home / Self Care | Payer: Medicare Other | Source: Ambulatory Visit | Attending: Oncology | Admitting: Oncology

## 2016-05-21 DIAGNOSIS — C3492 Malignant neoplasm of unspecified part of left bronchus or lung: Secondary | ICD-10-CM | POA: Diagnosis not present

## 2016-05-21 DIAGNOSIS — R911 Solitary pulmonary nodule: Secondary | ICD-10-CM | POA: Diagnosis present

## 2016-05-31 ENCOUNTER — Encounter (HOSPITAL_COMMUNITY): Payer: Self-pay

## 2016-06-01 ENCOUNTER — Encounter (HOSPITAL_COMMUNITY): Payer: Self-pay

## 2016-06-09 ENCOUNTER — Encounter (HOSPITAL_COMMUNITY): Payer: Self-pay

## 2016-06-21 ENCOUNTER — Encounter (HOSPITAL_COMMUNITY): Payer: Self-pay

## 2016-06-22 ENCOUNTER — Ambulatory Visit: Payer: Medicare Other | Admitting: Vascular Surgery

## 2016-06-23 ENCOUNTER — Encounter: Payer: Self-pay | Admitting: Cardiology

## 2016-06-29 ENCOUNTER — Encounter: Payer: Self-pay | Admitting: Vascular Surgery

## 2016-06-29 ENCOUNTER — Ambulatory Visit (INDEPENDENT_AMBULATORY_CARE_PROVIDER_SITE_OTHER): Payer: Medicare Other | Admitting: Vascular Surgery

## 2016-06-29 VITALS — BP 123/75 | HR 49 | Temp 98.0°F | Resp 20 | Ht 74.0 in | Wt 178.0 lb

## 2016-06-29 DIAGNOSIS — I714 Abdominal aortic aneurysm, without rupture, unspecified: Secondary | ICD-10-CM

## 2016-06-29 DIAGNOSIS — C3490 Malignant neoplasm of unspecified part of unspecified bronchus or lung: Secondary | ICD-10-CM | POA: Diagnosis not present

## 2016-06-29 NOTE — Progress Notes (Signed)
Vascular and Vein Specialist of [facility]  Patient name: George Macdonald MRN: [id number] DOB: 09/30/1945 Sex: male  REASON FOR VISIT: Further discussion of abdominal aortic aneurysm  HPI: George Macdonald is a 71 y.o. male here today for further discussion of his large abdominal aortic aneurysm. He has had a formal CT scan of his abdomen pelvis is my last visit with him as also had cardiac clearance. He does have metastatic lung cancer and is awaiting treatment until he has correction and treatment of his aneurysm.  Past Medical History:  Diagnosis Date  . Arthritis   . 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 Hypertension   . Laryngeal cancer ([place])   . Personal history of kidney cancer     Family History  Problem Relation Age of Onset  . Cancer Mother     SOCIAL HISTORY: Social History  Substance Use Topics  . Smoking status: Current Every Day Smoker    Years: 30.00    Types: Cigarettes  . Smokeless tobacco: Never Used     Comment: 1/2 pk per day  . Alcohol use Yes     Comment: OCCASIONAL    Allergies  Allergen Reactions  . Coconut Oil Shortness Of Breath    Current Outpatient Prescriptions  Medication Sig Dispense Refill  . amLODipine (NORVASC) 5 MG tablet Take 5 mg by mouth daily.    Marland Kitchen aspirin EC 81 MG tablet Take 81 mg by mouth daily.    Marland Kitchen atorvastatin (LIPITOR) 80 MG tablet Take 80 mg by mouth daily.    . bisoprolol (ZEBETA) 5 MG tablet Take 5 mg by mouth daily.    Marland Kitchen buPROPion (WELLBUTRIN XL) 150 MG 24 hr tablet Take 150 mg by mouth daily.    . clopidogrel (PLAVIX) 75 MG tablet Take 75 mg by mouth daily.    . fluticasone (FLONASE) 50 MCG/ACT nasal spray Place 1 spray into both nostrils daily as needed for allergies.     . fluticasone furoate-vilanterol (BREO ELLIPTA) 100-25 MCG/INH AEPB Inhale 1 puff into the lungs daily.    . Fluticasone-Salmeterol (ADVAIR) 250-50 MCG/DOSE AEPB Inhale 1 puff into the lungs daily.    Marland Kitchen  latanoprost (XALATAN) 0.005 % ophthalmic solution Place 1 drop into both eyes at bedtime.     Marland Kitchen levothyroxine (SYNTHROID, LEVOTHROID) 137 MCG tablet Take 137 mcg by mouth daily before breakfast.    . olmesartan-hydrochlorothiazide (BENICAR HCT) 40-25 MG tablet Take 1 tablet by mouth daily.    . pantoprazole (PROTONIX) 40 MG tablet Take 40 mg by mouth daily.    Marland Kitchen tiZANidine (ZANAFLEX) 4 MG tablet Take 4 mg by mouth every 8 (eight) hours as needed for muscle spasms.    . valsartan-hydrochlorothiazide (DIOVAN-HCT) 160-12.5 MG tablet Take 1 tablet by mouth daily.     No current facility-administered medications for this visit.     REVIEW OF SYSTEMS:  '[X]'$  denotes positive finding, '[ ]'$  denotes negative finding Cardiac  Comments:  Chest pain or chest pressure:    Shortness of breath upon exertion: x   Short of breath when lying flat:    Irregular heart rhythm:        Vascular    Pain in calf, thigh, or hip brought on by ambulation:    Pain in feet at night that wakes you up from your sleep:     Blood clot in your veins:    Leg swelling:           PHYSICAL EXAM: Vitals:  06/29/16 1514  BP: 123/75  Pulse: (!) 49  Resp: 20  Temp: 98 F (36.7 C)  TempSrc: Oral  SpO2: 99%  Weight: 178 lb (80.7 kg)  Height: '6\' 2"'$  (1.88 m)    GENERAL: The patient is a well-nourished male, in no acute distress. The vital signs are documented above. CARDIOVASCULAR: 2+ radial 2+ femoral pulses. Easily palpable aneurysm which is nontender PULMONARY: There is good air exchange  MUSCULOSKELETAL: There are no major deformities or cyanosis. NEUROLOGIC: No focal weakness or paresthesias are detected. SKIN: There are no ulcers or rashes noted. PSYCHIATRIC: The patient has a normal affect.  DATA:  CT scan shows diffuse arteriomegaly. He does have a 6.6 cm infrarenal aneurysm with extension into his iliac artery  MEDICAL ISSUES: Discussed the option of stent graft repair with patient. Have recommended that  we proceed with this as soon as possible. Oncologist is deferring treatment of his lung cancer until his aneurysm was treated. We will schedule this at his earliest [place]. Sidra Oldfield, MD [facility] Vascular and Vein Specialists of [facility] Tel [phone] Pager [phone]

## 2016-06-30 ENCOUNTER — Other Ambulatory Visit: Payer: Self-pay

## 2016-07-02 NOTE — Pre-Procedure Instructions (Signed)
    George Macdonald  07/02/2016      [facility] - [place] [address], [place] AT [address] [facility] [place] [address] Phone: [phone] Fax: [phone]  [place], [place] [address] [place] [address] Phone: [phone] Fax: [phone]    Your procedure is scheduled on 07/07/16.  Report to [facility] Admitting at 830 A.M.  Call this number if you have problems the morning of surgery:  [phone]   Remember:  Do not eat food or drink liquids after midnight.  Take these medicines the morning of surgery with A SIP OF WATER --tylenol,amlodipine,bisoprolol,all inhalers,protonix   Do not wear jewelry, make-up or nail polish.  Do not wear lotions, powders, or perfumes, or deoderant.  Do not shave 48 hours prior to surgery.  Men may shave face and neck.  Do not bring valuables to the hospital.  [facility] is not responsible for any belongings or valuables.  Contacts, dentures or bridgework may not be worn into surgery.  Leave your suitcase in the car.  After surgery it may be brought to your room.  For patients admitted to the hospital, discharge time will be determined by your treatment team.  Patients discharged the day of surgery will not be allowed to drive home.   Name and phone number of your driver:    Special instructions:  Do not take any aspirin,anti-inflammatories,vitamins,or herbal supplements 5-7 days prior to surgery.  Please read over the following fact sheets that you were given. MRSA Information

## 2016-07-05 ENCOUNTER — Encounter (HOSPITAL_COMMUNITY): Payer: Self-pay

## 2016-07-05 ENCOUNTER — Encounter (HOSPITAL_COMMUNITY)
Admission: RE | Admit: 2016-07-05 | Discharge: 2016-07-05 | Disposition: A | Discharge status: Home / Self Care | Payer: Medicare Other | Source: Ambulatory Visit | Attending: Vascular Surgery | Admitting: Vascular Surgery

## 2016-07-05 DIAGNOSIS — Z01812 Encounter for preprocedural laboratory examination: Secondary | ICD-10-CM

## 2016-07-05 DIAGNOSIS — I714 Abdominal aortic aneurysm, without rupture: Secondary | ICD-10-CM

## 2016-07-05 HISTORY — DX: Atherosclerotic heart disease of native coronary artery without angina pectoris: I25.10

## 2016-07-05 HISTORY — DX: Cerebral infarction, unspecified: I63.9

## 2016-07-05 HISTORY — DX: Unspecified asthma, uncomplicated: J45.909

## 2016-07-05 HISTORY — DX: Depression, unspecified: F32.A

## 2016-07-05 HISTORY — DX: Anxiety disorder, unspecified: F41.9

## 2016-07-05 HISTORY — DX: Dyspnea, unspecified: R06.00

## 2016-07-05 HISTORY — DX: Major depressive disorder, single episode, unspecified: F32.9

## 2016-07-05 LAB — TYPE AND SCREEN
ABO/RH(D): A POS
ANTIBODY SCREEN: NEGATIVE

## 2016-07-05 LAB — ABO/RH: ABO/RH(D): A POS

## 2016-07-05 LAB — PROTIME-INR
INR: 1.14
PROTHROMBIN TIME: 14.7 s (ref 11.4–15.2)

## 2016-07-05 LAB — COMPREHENSIVE METABOLIC PANEL
ALT: 9 U/L — ABNORMAL LOW (ref 17–63)
ANION GAP: 14 (ref 5–15)
AST: 13 U/L — ABNORMAL LOW (ref 15–41)
Albumin: 3.4 g/dL — ABNORMAL LOW (ref 3.5–5.0)
Alkaline Phosphatase: 88 U/L (ref 38–126)
BILIRUBIN TOTAL: 0.8 mg/dL (ref 0.3–1.2)
BUN: 32 mg/dL — ABNORMAL HIGH (ref 6–20)
CO2: 18 mmol/L — ABNORMAL LOW (ref 22–32)
Calcium: 8.6 mg/dL — ABNORMAL LOW (ref 8.9–10.3)
Chloride: 102 mmol/L (ref 101–111)
Creatinine, Ser: 1.94 mg/dL — ABNORMAL HIGH (ref 0.61–1.24)
GFR calc Af Amer: 39 mL/min — ABNORMAL LOW (ref >60)
GFR, EST NON AFRICAN AMERICAN: 34 mL/min — AB (ref >60)
Glucose, Bld: 126 mg/dL — ABNORMAL HIGH (ref 65–99)
POTASSIUM: 3.7 mmol/L (ref 3.5–5.1)
Sodium: 134 mmol/L — ABNORMAL LOW (ref 135–145)
TOTAL PROTEIN: 6.5 g/dL (ref 6.5–8.1)

## 2016-07-05 LAB — BLOOD GAS, ARTERIAL
ACID-BASE DEFICIT: 1.7 mmol/L (ref 0.0–2.0)
BICARBONATE: 22.2 mmol/L (ref 20.0–28.0)
Drawn by: 470591
FIO2: 21
O2 Saturation: 97.2 %
PATIENT TEMPERATURE: 98.6
PH ART: 7.419 (ref 7.350–7.450)
PO2 ART: 91.2 mmHg (ref 83.0–108.0)
pCO2 arterial: 34.9 mmHg (ref 32.0–48.0)

## 2016-07-05 LAB — CBC
HEMATOCRIT: 35.3 % — AB (ref 39.0–52.0)
Hemoglobin: 11.4 g/dL — ABNORMAL LOW (ref 13.0–17.0)
MCH: 28.9 pg (ref 26.0–34.0)
MCHC: 32.3 g/dL (ref 30.0–36.0)
MCV: 89.4 fL (ref 78.0–100.0)
Platelets: 191 10*3/uL (ref 150–400)
RBC: 3.95 MIL/uL — ABNORMAL LOW (ref 4.22–5.81)
RDW: 14.5 % (ref 11.5–15.5)
WBC: 7 10*3/uL (ref 4.0–10.5)

## 2016-07-05 LAB — APTT: APTT: 34 s (ref 24–36)

## 2016-07-05 LAB — SURGICAL PCR SCREEN
MRSA, PCR: NEGATIVE
STAPHYLOCOCCUS AUREUS: NEGATIVE

## 2016-07-05 MED ORDER — CHLORHEXIDINE GLUCONATE 4 % EX LIQD: 60.0000 mL | Freq: Once | CUTANEOUS | Status: DC

## 2016-07-06 ENCOUNTER — Other Ambulatory Visit: Payer: Self-pay

## 2016-07-06 ENCOUNTER — Encounter (HOSPITAL_COMMUNITY): Payer: Self-pay

## 2016-07-06 NOTE — Anesthesia Preprocedure Evaluation (Addendum)
Anesthesia Evaluation  Patient identified by MRN, date of birth, ID band Patient awake    Reviewed: Allergy & Precautions, H&P , NPO status , Patient's Chart, lab work & pertinent test results  History of Anesthesia Complications (+) POST - OP SPINAL HEADACHE  Airway Mallampati: II  TM Distance: >3 FB Neck ROM: Full    Dental no notable dental hx. (+) Edentulous Upper, Edentulous Lower, Dental Advisory Given   Pulmonary shortness of breath and with exertion, asthma , Current Smoker,    Pulmonary exam normal breath sounds clear to auscultation       Cardiovascular Exercise Tolerance: Good hypertension, Pt. on medications + CAD and + Peripheral Vascular Disease   Rhythm:Regular Rate:Normal     Neuro/Psych Anxiety Depression CVA, No Residual Symptoms    GI/Hepatic negative GI ROS, Neg liver ROS,   Endo/Other  negative endocrine ROS  Renal/GU negative Renal ROS  negative genitourinary   Musculoskeletal  (+) Arthritis , Osteoarthritis,    Abdominal   Peds  Hematology negative hematology ROS (+)   Anesthesia Other Findings   Reproductive/Obstetrics negative OB ROS                          Anesthesia Physical Anesthesia Plan  ASA: III  Anesthesia Plan: General   Post-op Pain Management:    Induction: Intravenous  Airway Management Planned: Oral ETT  Additional Equipment: Arterial line  Intra-op Plan:   Post-operative Plan: Extubation in OR and Possible Post-op intubation/ventilation  Informed Consent: I have reviewed the patients History and Physical, chart, labs and discussed the procedure including the risks, benefits and alternatives for the proposed anesthesia with the patient or authorized representative who has indicated his/her understanding and acceptance.   Dental advisory given  Plan Discussed with: CRNA, Anesthesiologist and Surgeon  Anesthesia Plan Comments:        Anesthesia Quick Evaluation

## 2016-07-06 NOTE — Progress Notes (Addendum)
Anesthesia chart review: Patient is a 71 year old male scheduled for endovascular stent graft repair of AAA on 07/07/2016 by Dr. Donnetta Hutching.  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 Claudia Pollock. - Cardiologist is Dr. Helene Kelp, last visit 06/17/16. He recommended continuing Plavix and ASA, otherwise felt patient "should be at low risk for cardiovascular event perioperatively for his AAA repair." (See Care Everywhere) - ENT is Dr. Corliss Skains with [facility]. 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  - Pulmonologist is Dr. Gardiner Rhyme.  Meds include amlodipine, aspirin 81 mg, Lipitor, bisoprolol, Wellbutrin XL, Plavix, Flonase, Breo Ellipta, Advair, Xalatan ophthalmic, levothyroxine, melatonin, Benicar HCT, Protonix, Zanaflex. Patient to continue Plavix and aspirin per Dr. Donnetta Hutching.   BP (!) 81/50 Comment: notified Kathie RN  Pulse 63   Temp 36.9 C   Resp 20   Ht '6\' 2"'$  (1.88 m)   Wt 178 lb (80.7 kg)   SpO2 95%   BMI 22.85 kg/m  Unfortunately, I was not notified of low BP at PAT and was not called to evaluate him.   EKG 04/12/16: SB at 59 bpm, PACs with abberant conduction. LAD, LAFB, cannot rule out septal infarct (age undetermined). T wave abnormality, consider lateral ischemia. 05/26/16 tracing requested from [facility]. I don't think that he has had an EKG since his stent was placed, so I will order a repeat EKG so we will have an updated baseline EKG prior to surgery.  Cardiac cath 05/26/16 ([place]; Care Everywhere): Coronary anatomy:  1. Left Main coronary artery: Normal 2. Left  anterior descending artery: Mid mild 25% disease. D1 is a medium caliber vessel without any significant stenosis. 3. Left circumflex artery: Has a proximal 75% stenosis with small vessel after a medium to large-caliber OM1 which is chronically occluded in the mid region and fills from left to left collateralization. 4. Right coronary artery: Large-caliber dominant vessel which has a proximal 85-90% and a mid 80% stenosis. There is also a severe distal disease and a small PLV and PDA branches. LV gram performed showed normal left ventricle systolic function with left ventricular ejection fraction of 55%. Trace mitral regurgitation noted. No significant gradient across the aortic valve on pullback. Preintervention showed 80-90% tandem stenosis of the proximal and mid RCA which is at type C lesion with TIMI III flow distally. Final result showed 0% residual stenosis with no dissection or intracoronary thrombus seen with TIMI III flow distally CONCLUSIONS: 1. Severe 2 vessel CAD with chronically occluded OM. 2. Normal LVEF. 3. Successful PTCA/stent placement of the proximal to mid RCA using bare-metal stent 4. Patient will be admitted to telemetry and continued on aspirin and Plavix.   Echo 12/15/15 ([place]): Summary: The study was technically adequate. Compared to prior study, changes are noted. The left ventricle is mildly dilated. Left ventricular systolic function is mildly reduced. LVEF 45%. The transmitral spectral Doppler flow pattern is suggestive of impaired LV relaxation. There is mild mitral regurgitation. Trace tricuspid regurgitation. RVSP is normal. Mild aortic sclerosis without stenosis. The aortic valve is trileaflet. Mild pulmonic regurgitation. Aortic root is normal in size.  Holter monitor 12/02/15 ([place]): Mostly isolated PACs  and PVCs with 2 short runs of atrial tachycardia of 4-5 beats and one episode of ventricular triplet.  Carotid U/S 05/03/16 [facility];  report found in PACS): Impression: Right: Heterogeneous and partially calcified plaque at the carotid bifurcation, with discordant results regarding degree of stenosis by established duplex criteria. Peak velocities suggest 50-69% stenosis, with the ICA/CCA ratio suggests a greater degree of stenosis. If establishing a more accurate agrees stenosis is required, cerebral angiogram should be considered, particularly given the recent findings of CT neck. Left: Color duplex indicates minimal heterogeneous and calcified plaque, with no hemodynamically significant stenosis by duplex criteria next cranial cerebrovascular circulation.  CTA neck 04/23/16 [facility]): Impression:  1. Extensive atherosclerotic disease in the carotid artery bilaterally. There is thickening of the common carotid artery bilaterally. There is severe stenosis of the right internal carotid artery proximally and atherosclerotic disease and mild stenosis of the left internal carotid artery. Both vertebral arteries are patent with the right vertebral artery dominant. 2. Tell millimeters left parotid lesion. This may represent a parotid neoplasm such as a pleomorphic adenoma or malignancy. Parotid lymph node also possible. Atrophic changes in the right parotid and submandibular glands bilaterally. 3. Negative for adenopathy in the neck. 4. Mucosal edema involving the epiglottis and larynx. Question radiation to the neck. 5. Left upper lobe mass lesion 3433 mm, presumed lung cancer. New cavitary nodule with air-fluid level left upper lobe measuring 17 mm. Possible infection.  CTA abd/pelvis 05/05/16 [facility]): Impression: 6.6 cm infrarenal atherosclerotic AAA. 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 Aortoiliac atherosclerosis without occlusive process. Preserved patency of the celiac and SMA with occlusion of the IMA. Chronic occlusion of the proximal right SFA. No other acute intra-abdominopelvic process.  CXR 04/12/16:  IMPRESSION: Subsegmental atelectasis in the left upper lobe and likely in the left lower lobe. No pneumothorax or pleural effusion.  Preoperative labs noted. BUN 32, Cr 1.94--this is up from 1.20 on 05/27/16 (Care Everywhere). H/H 11.4/35.3. PT/PTT WNL. Glucose 126. T&S done.   I called V[facility] RN Colletta Maryland regarding hypotension and acute renal failure by labs. I have not been able to reach patient by phone. She discussed with Dr. Donnetta Hutching regarding recommendations if patient should present to the ED for further evaluation or be admitted for hydration. Dr. Donnetta Hutching felt patient could come in tomorrow to be evaluated in [place] by surgeon and anesthesiologist. STAT BMET will be done to re-evaluate renal function. Dr. Donnetta Hutching is inclined that procedure will need to be done due to large AAA size and continued delay in his lung cancer treatment if procedure isn't done. He will plan to minimize contrast. Definitive plan will be based on lab results and patient evaluation findings tomorrow.   George Hugh [facility] Short Stay Center/Anesthesiology Phone [phone] 07/06/2016 1:59 PM

## 2016-07-07 ENCOUNTER — Inpatient Hospital Stay (HOSPITAL_COMMUNITY): Payer: Medicare Other | Admitting: Vascular Surgery

## 2016-07-07 ENCOUNTER — Encounter (HOSPITAL_COMMUNITY): Payer: Self-pay | Admitting: Certified Registered Nurse Anesthetist

## 2016-07-07 ENCOUNTER — Inpatient Hospital Stay (HOSPITAL_COMMUNITY)
Admission: RE | Admit: 2016-07-07 | Discharge: 2016-07-08 | DRG: 269 | Disposition: A | Discharge status: Home Health | Payer: Medicare Other | Source: Ambulatory Visit | Attending: Vascular Surgery | Admitting: Vascular Surgery

## 2016-07-07 ENCOUNTER — Encounter (HOSPITAL_COMMUNITY)
Admission: RE | Disposition: A | Discharge status: Home Health | Payer: Self-pay | Source: Ambulatory Visit | Attending: Vascular Surgery

## 2016-07-07 ENCOUNTER — Inpatient Hospital Stay (HOSPITAL_COMMUNITY): Payer: Medicare Other

## 2016-07-07 DIAGNOSIS — Z8522 Personal history of malignant neoplasm of nasal cavities, middle ear, and accessory sinuses: Secondary | ICD-10-CM

## 2016-07-07 DIAGNOSIS — I10 Essential (primary) hypertension: Secondary | ICD-10-CM | POA: Diagnosis present

## 2016-07-07 DIAGNOSIS — Z8673 Personal history of transient ischemic attack (TIA), and cerebral infarction without residual deficits: Secondary | ICD-10-CM | POA: Diagnosis not present

## 2016-07-07 DIAGNOSIS — M1991 Primary osteoarthritis, unspecified site: Secondary | ICD-10-CM | POA: Diagnosis present

## 2016-07-07 DIAGNOSIS — F17210 Nicotine dependence, cigarettes, uncomplicated: Secondary | ICD-10-CM | POA: Diagnosis present

## 2016-07-07 DIAGNOSIS — Z79899 Other long term (current) drug therapy: Secondary | ICD-10-CM

## 2016-07-07 DIAGNOSIS — Z7982 Long term (current) use of aspirin: Secondary | ICD-10-CM | POA: Diagnosis not present

## 2016-07-07 DIAGNOSIS — I739 Peripheral vascular disease, unspecified: Secondary | ICD-10-CM | POA: Diagnosis present

## 2016-07-07 DIAGNOSIS — Z7902 Long term (current) use of antithrombotics/antiplatelets: Secondary | ICD-10-CM | POA: Diagnosis not present

## 2016-07-07 DIAGNOSIS — Z91018 Allergy to other foods: Secondary | ICD-10-CM | POA: Diagnosis not present

## 2016-07-07 DIAGNOSIS — F418 Other specified anxiety disorders: Secondary | ICD-10-CM | POA: Diagnosis present

## 2016-07-07 DIAGNOSIS — Z8521 Personal history of malignant neoplasm of larynx: Secondary | ICD-10-CM | POA: Diagnosis not present

## 2016-07-07 DIAGNOSIS — Z7951 Long term (current) use of inhaled steroids: Secondary | ICD-10-CM | POA: Diagnosis not present

## 2016-07-07 DIAGNOSIS — I714 Abdominal aortic aneurysm, without rupture, unspecified: Secondary | ICD-10-CM | POA: Diagnosis present

## 2016-07-07 DIAGNOSIS — C3490 Malignant neoplasm of unspecified part of unspecified bronchus or lung: Secondary | ICD-10-CM | POA: Diagnosis present

## 2016-07-07 DIAGNOSIS — Z9889 Other specified postprocedural states: Secondary | ICD-10-CM

## 2016-07-07 DIAGNOSIS — I2510 Atherosclerotic heart disease of native coronary artery without angina pectoris: Secondary | ICD-10-CM | POA: Diagnosis present

## 2016-07-07 HISTORY — PX: ABDOMINAL AORTIC ENDOVASCULAR STENT GRAFT: SHX5707

## 2016-07-07 LAB — BASIC METABOLIC PANEL
ANION GAP: 10 (ref 5–15)
Anion gap: 10 (ref 5–15)
BUN: 31 mg/dL — ABNORMAL HIGH (ref 6–20)
BUN: 31 mg/dL — AB (ref 6–20)
CALCIUM: 8.3 mg/dL — AB (ref 8.9–10.3)
CO2: 21 mmol/L — AB (ref 22–32)
CO2: 22 mmol/L (ref 22–32)
CREATININE: 1.67 mg/dL — AB (ref 0.61–1.24)
Calcium: 8.1 mg/dL — ABNORMAL LOW (ref 8.9–10.3)
Chloride: 104 mmol/L (ref 101–111)
Chloride: 105 mmol/L (ref 101–111)
Creatinine, Ser: 1.61 mg/dL — ABNORMAL HIGH (ref 0.61–1.24)
GFR calc Af Amer: 47 mL/min — ABNORMAL LOW (ref >60)
GFR calc Af Amer: 49 mL/min — ABNORMAL LOW (ref >60)
GFR, EST NON AFRICAN AMERICAN: 40 mL/min — AB (ref >60)
GFR, EST NON AFRICAN AMERICAN: 42 mL/min — AB (ref >60)
GLUCOSE: 102 mg/dL — AB (ref 65–99)
GLUCOSE: 125 mg/dL — AB (ref 65–99)
POTASSIUM: 3.8 mmol/L (ref 3.5–5.1)
Potassium: 3.9 mmol/L (ref 3.5–5.1)
Sodium: 135 mmol/L (ref 135–145)
Sodium: 137 mmol/L (ref 135–145)

## 2016-07-07 LAB — CBC
HCT: 31.2 % — ABNORMAL LOW (ref 39.0–52.0)
Hemoglobin: 10.2 g/dL — ABNORMAL LOW (ref 13.0–17.0)
MCH: 29.6 pg (ref 26.0–34.0)
MCHC: 32.7 g/dL (ref 30.0–36.0)
MCV: 90.4 fL (ref 78.0–100.0)
PLATELETS: 130 10*3/uL — AB (ref 150–400)
RBC: 3.45 MIL/uL — AB (ref 4.22–5.81)
RDW: 14.6 % (ref 11.5–15.5)
WBC: 4.8 10*3/uL (ref 4.0–10.5)

## 2016-07-07 LAB — URINALYSIS, ROUTINE W REFLEX MICROSCOPIC
BACTERIA UA: NONE SEEN
Bilirubin Urine: NEGATIVE
GLUCOSE, UA: NEGATIVE mg/dL
KETONES UR: NEGATIVE mg/dL
LEUKOCYTES UA: NEGATIVE
Nitrite: NEGATIVE
PH: 6 (ref 5.0–8.0)
Protein, ur: NEGATIVE mg/dL
SPECIFIC GRAVITY, URINE: 1.016 (ref 1.005–1.030)

## 2016-07-07 LAB — PROTIME-INR
INR: 1.1
Prothrombin Time: 14.3 seconds (ref 11.4–15.2)

## 2016-07-07 LAB — MAGNESIUM: MAGNESIUM: 1.6 mg/dL — AB (ref 1.7–2.4)

## 2016-07-07 LAB — APTT: aPTT: 35 seconds (ref 24–36)

## 2016-07-07 SURGERY — INSERTION, ENDOVASCULAR STENT GRAFT, AORTA, ABDOMINAL
Anesthesia: General | Site: Abdomen

## 2016-07-07 MED ORDER — SODIUM CHLORIDE 0.9 % IV SOLN
INTRAVENOUS | Status: DC | PRN
Start: 2016-07-07 — End: 2016-07-07
  Administered 2016-07-07: 11:00:00 500 mL

## 2016-07-07 MED ORDER — 0.9 % SODIUM CHLORIDE (POUR BTL) OPTIME
TOPICAL | Status: DC | PRN
  Administered 2016-07-07: 1000 mL

## 2016-07-07 MED ORDER — SODIUM CHLORIDE 0.9 % IV SOLN: INTRAVENOUS | Status: DC

## 2016-07-07 MED ORDER — GUAIFENESIN-DM 100-10 MG/5ML PO SYRP: 15.0000 mL | ORAL_SOLUTION | ORAL | Status: DC | PRN

## 2016-07-07 MED ORDER — SODIUM CHLORIDE 0.9 % IV SOLN: 500.0000 mL | Freq: Once | INTRAVENOUS | Status: DC | PRN

## 2016-07-07 MED ORDER — LACTATED RINGERS IV SOLN
INTRAVENOUS | Status: DC | PRN
  Administered 2016-07-07 (×2): via INTRAVENOUS

## 2016-07-07 MED ORDER — DOCUSATE SODIUM 100 MG PO CAPS
100.0000 mg | ORAL_CAPSULE | Freq: Every day | ORAL | Status: DC
  Administered 2016-07-08: 100 mg via ORAL
  Filled 2016-07-07: qty 1

## 2016-07-07 MED ORDER — MORPHINE SULFATE (PF) 2 MG/ML IV SOLN
INTRAVENOUS | Status: AC
  Administered 2016-07-07: 2 mg via INTRAVENOUS
  Filled 2016-07-07: qty 1

## 2016-07-07 MED ORDER — CHLORHEXIDINE GLUCONATE CLOTH 2 % EX PADS: 6.0000 | MEDICATED_PAD | Freq: Once | CUTANEOUS | Status: DC

## 2016-07-07 MED ORDER — CEFUROXIME SODIUM 1.5 G IJ SOLR
1.5000 g | Freq: Two times a day (BID) | INTRAMUSCULAR | Status: AC
  Administered 2016-07-07 – 2016-07-08 (×2): 1.5 g via INTRAVENOUS
  Filled 2016-07-07 (×2): qty 1.5

## 2016-07-07 MED ORDER — HYDROMORPHONE HCL 1 MG/ML IJ SOLN: 0.2500 mg | INTRAMUSCULAR | Status: DC | PRN

## 2016-07-07 MED ORDER — ACETAMINOPHEN 500 MG PO TABS: 1000.0000 mg | ORAL_TABLET | Freq: Four times a day (QID) | ORAL | Status: DC | PRN

## 2016-07-07 MED ORDER — ATORVASTATIN CALCIUM 80 MG PO TABS
80.0000 mg | ORAL_TABLET | Freq: Every day | ORAL | Status: DC
  Administered 2016-07-07: 80 mg via ORAL
  Filled 2016-07-07: qty 1

## 2016-07-07 MED ORDER — PROPOFOL 10 MG/ML IV BOLUS
INTRAVENOUS | Status: DC | PRN
  Administered 2016-07-07: 70 mg via INTRAVENOUS

## 2016-07-07 MED ORDER — MORPHINE SULFATE (PF) 2 MG/ML IV SOLN
2.0000 mg | INTRAVENOUS | Status: DC | PRN
  Administered 2016-07-07: 2 mg via INTRAVENOUS

## 2016-07-07 MED ORDER — ASPIRIN EC 81 MG PO TBEC
81.0000 mg | DELAYED_RELEASE_TABLET | Freq: Every day | ORAL | Status: DC
  Administered 2016-07-08: 81 mg via ORAL
  Filled 2016-07-07: qty 1

## 2016-07-07 MED ORDER — MAGNESIUM SULFATE 2 GM/50ML IV SOLN: 2.0000 g | Freq: Every day | INTRAVENOUS | Status: DC | PRN

## 2016-07-07 MED ORDER — MIDAZOLAM HCL 2 MG/2ML IJ SOLN
INTRAMUSCULAR | Status: AC
  Filled 2016-07-07: qty 2

## 2016-07-07 MED ORDER — BUPROPION HCL ER (XL) 150 MG PO TB24
150.0000 mg | ORAL_TABLET | Freq: Every day | ORAL | Status: DC
Start: 2016-07-08 — End: 2016-07-08
  Administered 2016-07-08: 150 mg via ORAL
  Filled 2016-07-07: qty 1

## 2016-07-07 MED ORDER — LATANOPROST 0.005 % OP SOLN
1.0000 [drp] | Freq: Every day | OPHTHALMIC | Status: DC
  Filled 2016-07-07 (×2): qty 2.5

## 2016-07-07 MED ORDER — LABETALOL HCL 5 MG/ML IV SOLN: 10.0000 mg | INTRAVENOUS | Status: DC | PRN

## 2016-07-07 MED ORDER — PROTAMINE SULFATE 10 MG/ML IV SOLN
INTRAVENOUS | Status: DC | PRN
  Administered 2016-07-07: 10 mg via INTRAVENOUS
  Administered 2016-07-07: 20 mg via INTRAVENOUS
  Administered 2016-07-07 (×2): 10 mg via INTRAVENOUS

## 2016-07-07 MED ORDER — HEPARIN SODIUM (PORCINE) 1000 UNIT/ML IJ SOLN
INTRAMUSCULAR | Status: DC | PRN
  Administered 2016-07-07: 6 mL via INTRAVENOUS

## 2016-07-07 MED ORDER — POTASSIUM CHLORIDE CRYS ER 20 MEQ PO TBCR: 20.0000 meq | EXTENDED_RELEASE_TABLET | Freq: Every day | ORAL | Status: DC | PRN

## 2016-07-07 MED ORDER — CEFUROXIME SODIUM 1.5 G IJ SOLR
1.5000 g | INTRAMUSCULAR | Status: AC
  Administered 2016-07-07: 1.5 g via INTRAVENOUS
  Filled 2016-07-07: qty 1.5

## 2016-07-07 MED ORDER — FLUTICASONE PROPIONATE 50 MCG/ACT NA SUSP: 1.0000 | Freq: Every day | NASAL | Status: DC | PRN

## 2016-07-07 MED ORDER — PHENOL 1.4 % MT LIQD: 1.0000 | OROMUCOSAL | Status: DC | PRN

## 2016-07-07 MED ORDER — LACTATED RINGERS IV SOLN
INTRAVENOUS | Status: DC | PRN
  Administered 2016-07-07: 09:00:00 via INTRAVENOUS

## 2016-07-07 MED ORDER — MOMETASONE FURO-FORMOTEROL FUM 200-5 MCG/ACT IN AERO
2.0000 | INHALATION_SPRAY | Freq: Two times a day (BID) | RESPIRATORY_TRACT | Status: DC
  Administered 2016-07-07 – 2016-07-08 (×2): 2 via RESPIRATORY_TRACT
  Filled 2016-07-07: qty 8.8

## 2016-07-07 MED ORDER — HYDROCHLOROTHIAZIDE 25 MG PO TABS
25.0000 mg | ORAL_TABLET | Freq: Every day | ORAL | Status: DC
  Administered 2016-07-08: 25 mg via ORAL
  Filled 2016-07-07: qty 1

## 2016-07-07 MED ORDER — CLOPIDOGREL BISULFATE 75 MG PO TABS
75.0000 mg | ORAL_TABLET | Freq: Every day | ORAL | Status: DC
  Administered 2016-07-08: 75 mg via ORAL
  Filled 2016-07-07: qty 1

## 2016-07-07 MED ORDER — FLUTICASONE FUROATE-VILANTEROL 100-25 MCG/INH IN AEPB: 1.0000 | INHALATION_SPRAY | Freq: Every day | RESPIRATORY_TRACT | Status: DC

## 2016-07-07 MED ORDER — PANTOPRAZOLE SODIUM 40 MG PO TBEC
40.0000 mg | DELAYED_RELEASE_TABLET | Freq: Every day | ORAL | Status: DC
  Administered 2016-07-08: 40 mg via ORAL
  Filled 2016-07-07: qty 1

## 2016-07-07 MED ORDER — DEXTROSE 5 % IV SOLN
INTRAVENOUS | Status: DC | PRN
  Administered 2016-07-07: 20 ug/min via INTRAVENOUS

## 2016-07-07 MED ORDER — IRBESARTAN 300 MG PO TABS
300.0000 mg | ORAL_TABLET | Freq: Every day | ORAL | Status: DC
  Filled 2016-07-07 (×2): qty 1

## 2016-07-07 MED ORDER — OLMESARTAN MEDOXOMIL-HCTZ 40-25 MG PO TABS: 1.0000 | ORAL_TABLET | Freq: Every day | ORAL | Status: DC

## 2016-07-07 MED ORDER — METOPROLOL TARTRATE 5 MG/5ML IV SOLN: 2.0000 mg | INTRAVENOUS | Status: DC | PRN

## 2016-07-07 MED ORDER — ENOXAPARIN SODIUM 30 MG/0.3ML ~~LOC~~ SOLN: 30.0000 mg | SUBCUTANEOUS | Status: DC

## 2016-07-07 MED ORDER — TIZANIDINE HCL 2 MG PO TABS: 4.0000 mg | ORAL_TABLET | Freq: Three times a day (TID) | ORAL | Status: DC | PRN

## 2016-07-07 MED ORDER — FENTANYL CITRATE (PF) 100 MCG/2ML IJ SOLN
INTRAMUSCULAR | Status: DC | PRN
  Administered 2016-07-07: 100 ug via INTRAVENOUS

## 2016-07-07 MED ORDER — AMLODIPINE BESYLATE 5 MG PO TABS
5.0000 mg | ORAL_TABLET | Freq: Every day | ORAL | Status: DC
  Administered 2016-07-08: 5 mg via ORAL
  Filled 2016-07-07: qty 1

## 2016-07-07 MED ORDER — OXYCODONE-ACETAMINOPHEN 5-325 MG PO TABS
1.0000 | ORAL_TABLET | ORAL | Status: DC | PRN
  Administered 2016-07-07: 2 via ORAL
  Filled 2016-07-07: qty 2

## 2016-07-07 MED ORDER — BISOPROLOL FUMARATE 5 MG PO TABS
5.0000 mg | ORAL_TABLET | Freq: Every day | ORAL | Status: DC
  Administered 2016-07-08: 5 mg via ORAL
  Filled 2016-07-07: qty 1

## 2016-07-07 MED ORDER — FENTANYL CITRATE (PF) 250 MCG/5ML IJ SOLN
INTRAMUSCULAR | Status: AC
  Filled 2016-07-07: qty 5

## 2016-07-07 MED ORDER — ONDANSETRON HCL 4 MG/2ML IJ SOLN: 4.0000 mg | Freq: Four times a day (QID) | INTRAMUSCULAR | Status: DC | PRN

## 2016-07-07 MED ORDER — SUGAMMADEX SODIUM 200 MG/2ML IV SOLN
INTRAVENOUS | Status: DC | PRN
  Administered 2016-07-07: 200 mg via INTRAVENOUS

## 2016-07-07 MED ORDER — EPHEDRINE 5 MG/ML INJ
INTRAVENOUS | Status: AC
  Filled 2016-07-07: qty 10

## 2016-07-07 MED ORDER — ROCURONIUM BROMIDE 100 MG/10ML IV SOLN
INTRAVENOUS | Status: DC | PRN
  Administered 2016-07-07 (×2): 10 mg via INTRAVENOUS
  Administered 2016-07-07: 50 mg via INTRAVENOUS

## 2016-07-07 MED ORDER — PROPOFOL 10 MG/ML IV BOLUS
INTRAVENOUS | Status: AC
  Filled 2016-07-07: qty 20

## 2016-07-07 MED ORDER — ALUM & MAG HYDROXIDE-SIMETH 200-200-20 MG/5ML PO SUSP: 15.0000 mL | ORAL | Status: DC | PRN

## 2016-07-07 MED ORDER — IODIXANOL 320 MG/ML IV SOLN
INTRAVENOUS | Status: DC | PRN
  Administered 2016-07-07: 150 mL via INTRA_ARTERIAL

## 2016-07-07 MED ORDER — ONDANSETRON HCL 4 MG/2ML IJ SOLN
INTRAMUSCULAR | Status: DC | PRN
  Administered 2016-07-07: 4 mg via INTRAVENOUS

## 2016-07-07 MED ORDER — HYDRALAZINE HCL 20 MG/ML IJ SOLN: 5.0000 mg | INTRAMUSCULAR | Status: DC | PRN

## 2016-07-07 MED ORDER — LIDOCAINE HCL (CARDIAC) 20 MG/ML IV SOLN
INTRAVENOUS | Status: DC | PRN
  Administered 2016-07-07: 60 mg via INTRAVENOUS

## 2016-07-07 MED ORDER — SUCCINYLCHOLINE CHLORIDE 200 MG/10ML IV SOSY
PREFILLED_SYRINGE | INTRAVENOUS | Status: AC
  Filled 2016-07-07: qty 10

## 2016-07-07 MED ORDER — BOOST HIGH PROTEIN PO LIQD
1.0000 | Freq: Every day | ORAL | Status: DC | PRN
  Filled 2016-07-07: qty 237

## 2016-07-07 MED ORDER — ONDANSETRON HCL 4 MG/2ML IJ SOLN
INTRAMUSCULAR | Status: AC
  Filled 2016-07-07: qty 2

## 2016-07-07 MED ORDER — LEVOTHYROXINE SODIUM 150 MCG PO TABS
150.0000 ug | ORAL_TABLET | Freq: Every day | ORAL | Status: DC
  Administered 2016-07-08: 150 ug via ORAL
  Filled 2016-07-07 (×2): qty 2
  Filled 2016-07-07: qty 1

## 2016-07-07 MED ORDER — MELATONIN 3 MG PO TABS
9.0000 mg | ORAL_TABLET | Freq: Every evening | ORAL | Status: DC | PRN
  Filled 2016-07-07: qty 3

## 2016-07-07 SURGICAL SUPPLY — 52 items
CANISTER SUCTION 2500CC (MISCELLANEOUS) ×3 IMPLANT
CATH ANGIO 5F BER2 65CM (CATHETERS) ×3 IMPLANT
CATH BEACON 5.038 65CM KMP-01 (CATHETERS) IMPLANT
CATH OMNI FLUSH .035X70CM (CATHETERS) ×3 IMPLANT
CLIP LIGATING EXTRA MED SLVR (CLIP) IMPLANT
CLIP LIGATING EXTRA SM BLUE (MISCELLANEOUS) IMPLANT
COVER PROBE W GEL 5X96 (DRAPES) ×3 IMPLANT
DERMABOND ADVANCED (GAUZE/BANDAGES/DRESSINGS) ×2
DERMABOND ADVANCED .7 DNX12 (GAUZE/BANDAGES/DRESSINGS) ×1 IMPLANT
DEVICE CLOSURE PERCLS PRGLD 6F (VASCULAR PRODUCTS) ×5 IMPLANT
DRSG TEGADERM 2-3/8X2-3/4 SM (GAUZE/BANDAGES/DRESSINGS) ×6 IMPLANT
DRYSEAL FLEXSHEATH 12FR 33CM (SHEATH) ×2
DRYSEAL FLEXSHEATH 18FR 33CM (SHEATH) ×2
ELECT REM PT RETURN 9FT ADLT (ELECTROSURGICAL) ×6
ELECTRODE REM PT RTRN 9FT ADLT (ELECTROSURGICAL) ×2 IMPLANT
EXCLUDER TNK LEG 35MX14X14 (Endovascular Graft) ×1 IMPLANT
EXCLUDER TRUNK LEG 35MX14X14 (Endovascular Graft) ×3 IMPLANT
GAUZE SPONGE 2X2 8PLY STRL LF (GAUZE/BANDAGES/DRESSINGS) ×1 IMPLANT
GLOVE SS BIOGEL STRL SZ 7.5 (GLOVE) ×1 IMPLANT
GLOVE SUPERSENSE BIOGEL SZ 7.5 (GLOVE) ×2
GOWN STRL REUS W/ TWL LRG LVL3 (GOWN DISPOSABLE) ×3 IMPLANT
GOWN STRL REUS W/TWL LRG LVL3 (GOWN DISPOSABLE) ×6
GRAFT BALLN CATH 65CM (STENTS) ×1 IMPLANT
KIT BASIN OR (CUSTOM PROCEDURE TRAY) ×3 IMPLANT
KIT ROOM TURNOVER OR (KITS) ×3 IMPLANT
LEG CONTRALATERAL 18X11.5 (Endovascular Graft) ×3 IMPLANT
LEG CONTRALATERAL 27X10 (Vascular Products) ×3 IMPLANT
NEEDLE PERC 18GX7CM (NEEDLE) ×3 IMPLANT
NS IRRIG 1000ML POUR BTL (IV SOLUTION) ×3 IMPLANT
PACK ENDOVASCULAR (PACKS) ×3 IMPLANT
PAD ARMBOARD 7.5X6 YLW CONV (MISCELLANEOUS) ×6 IMPLANT
PERCLOSE PROGLIDE 6F (VASCULAR PRODUCTS) ×15
SHEATH AVANTI 11CM 8FR (MISCELLANEOUS) ×3 IMPLANT
SHEATH BRITE TIP 8FR 23CM (MISCELLANEOUS) ×3 IMPLANT
SHEATH DRYSEAL FLEX 12FR 33CM (SHEATH) ×1 IMPLANT
SHEATH DRYSEAL FLEX 18FR 33CM (SHEATH) ×1 IMPLANT
SPONGE GAUZE 2X2 STER 10/PKG (GAUZE/BANDAGES/DRESSINGS) ×2
STAPLER VISISTAT 35W (STAPLE) IMPLANT
STENT GRAFT BALLN CATH 65CM (STENTS) ×2
STOPCOCK MORSE 400PSI 3WAY (MISCELLANEOUS) ×3 IMPLANT
SUT ETHILON 3 0 PS 1 (SUTURE) IMPLANT
SUT PROLENE 5 0 C 1 24 (SUTURE) IMPLANT
SUT VIC AB 2-0 CTX 36 (SUTURE) IMPLANT
SUT VIC AB 3-0 SH 18 (SUTURE) IMPLANT
SUT VIC AB 3-0 SH 27 (SUTURE)
SUT VIC AB 3-0 SH 27X BRD (SUTURE) IMPLANT
SUT VICRYL 4-0 PS2 18IN ABS (SUTURE) ×6 IMPLANT
SYR 30ML LL (SYRINGE) ×3 IMPLANT
TRAY FOLEY W/METER SILVER 16FR (SET/KITS/TRAYS/PACK) ×3 IMPLANT
TUBING HIGH PRESSURE 120CM (CONNECTOR) ×3 IMPLANT
WIRE AMPLATZ SS-J .035X180CM (WIRE) ×6 IMPLANT
WIRE BENTSON .035X145CM (WIRE) ×6 IMPLANT

## 2016-07-07 NOTE — Op Note (Signed)
    OPERATIVE REPORT  DATE OF SURGERY: 07/07/2016  PATIENT: George Macdonald, 71 y.o. male MRN: [id number]  DOB: Apr 11, 1946  PRE-OPERATIVE DIAGNOSIS: Abdominal aortic aneurysm  POST-OPERATIVE DIAGNOSIS:  Same  PROCEDURE: Gore stent graft repair of abdominal aortic aneurysm  SURGEON:  Curt Jews, M.D.  PHYSICIAN ASSISTANT: Dyanne Iha PA-C  ANESTHESIA:  Gen.  EBL: 200 ml  Total I/O In: 1600 [I.V.:1600] Out: 750 [Urine:550; Blood:200]  BLOOD ADMINISTERED: None  DRAINS: None  SPECIMEN: None  COUNTS CORRECT:  YES  PLAN OF CARE: PACU   PATIENT DISPOSITION:  PACU - hemodynamically stable  PROCEDURE DETAILS: Patient was taken to Room placed supine position where the area of the abdomen and both groins prepped and draped in usual sterile fashion. Using SonoSite ultrasound for access a singlewall puncture of the common femoral artery and 18-gauge needle bilaterally was accomplished. A Bentson wire was passed centrally through the 18-gauge wire to the level of the aorta. 2 separate Perclose devices were used in the both the right and left groin and were placed at the 10:00 and 2:00 position and partially deployed. 8 French sheath was passed over the guidewire. A guiding catheter used bilaterally and the wires were exchanged for Amplatz superstiff wires bilaterally. A 12 French sheath was passed through the right groin and a 18 French sheath was passed over the left guidewire. The main body was a 35 x 14 x 14 and was placed across leg configuration through the left femoral sheath. This was positioned at the level of junction of the first and second lumbar disc. A marker pigtail catheter was passed through the 12 French sheath on the right and the arteriogram revealed the level of the renal artery takeoffs. The main body was deployed just below the takeoff of the renal artery. Different distal arteriogram showed good placement. Next Amplatz wire was positioned back through the pigtail  catheter which showed a was removed. The Bentson wire was placed in a as a buddy wire and the 12 French sheath alongside the Amplatz. A guiding catheter was used to cannulate the contralateral gate. This was confirmed by replacing the pigtail catheter in the body of the main graft and this was controlled to assure that this was in the main body. The left leg extension was chosen. Retrograde hand injection to the left sheath revealed the level of the hypogastric takeoff. A flared extension which was a 27 x 10 was positioned LAN just above the hypogastric artery takeoff and was deployed. The right limb was an 18 x 12 and this was also deployed at the [facility] just above the hypogastric takeoff which was confirmed with a hand injection to the right femoral sheath. A every 50 balloon was used to gently dilate the proximal and distal insertion areas and also all overlaps. The catheter was again positioned above the level renal arteries and a final injection revealed excellent placement of the stent graft with no evidence of type I endoleak. There was late filling of the lumbar vessels. The superstiff wires were exchanged for a Bentson wires over catheters. The femoral sheaths were removed and the prior placed Perclose devices were secured. The patient had been given 6015 separate. This was reversed with protamine 50 mg. Sterile dressing was applied the patient was transferred to the recovery room in stable condition   Rosetta Posner, M.D., [facility] 07/07/2016 4:10 PM

## 2016-07-07 NOTE — H&P (View-Only) (Signed)
Vascular and Vein Specialist of [facility]  Patient name: George Macdonald MRN: [id number] DOB: 1945-10-25 Sex: male  REASON FOR VISIT: Further discussion of abdominal aortic aneurysm  HPI: Frenchie Dangerfield is a 71 y.o. male here today for further discussion of his large abdominal aortic aneurysm. He has had a formal CT scan of his abdomen pelvis is my last visit with him as also had cardiac clearance. He does have metastatic lung cancer and is awaiting treatment until he has correction and treatment of his aneurysm.  Past Medical History:  Diagnosis Date  . Arthritis   . 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 Hypertension   . Laryngeal cancer ([place])   . Personal history of kidney cancer     Family History  Problem Relation Age of Onset  . Cancer Mother     SOCIAL HISTORY: Social History  Substance Use Topics  . Smoking status: Current Every Day Smoker    Years: 30.00    Types: Cigarettes  . Smokeless tobacco: Never Used     Comment: 1/2 pk per day  . Alcohol use Yes     Comment: OCCASIONAL    Allergies  Allergen Reactions  . Coconut Oil Shortness Of Breath    Current Outpatient Prescriptions  Medication Sig Dispense Refill  . amLODipine (NORVASC) 5 MG tablet Take 5 mg by mouth daily.    Marland Kitchen aspirin EC 81 MG tablet Take 81 mg by mouth daily.    Marland Kitchen atorvastatin (LIPITOR) 80 MG tablet Take 80 mg by mouth daily.    . bisoprolol (ZEBETA) 5 MG tablet Take 5 mg by mouth daily.    Marland Kitchen buPROPion (WELLBUTRIN XL) 150 MG 24 hr tablet Take 150 mg by mouth daily.    . clopidogrel (PLAVIX) 75 MG tablet Take 75 mg by mouth daily.    . fluticasone (FLONASE) 50 MCG/ACT nasal spray Place 1 spray into both nostrils daily as needed for allergies.     . fluticasone furoate-vilanterol (BREO ELLIPTA) 100-25 MCG/INH AEPB Inhale 1 puff into the lungs daily.    . Fluticasone-Salmeterol (ADVAIR) 250-50 MCG/DOSE AEPB Inhale 1 puff into the lungs daily.    Marland Kitchen  latanoprost (XALATAN) 0.005 % ophthalmic solution Place 1 drop into both eyes at bedtime.     Marland Kitchen levothyroxine (SYNTHROID, LEVOTHROID) 137 MCG tablet Take 137 mcg by mouth daily before breakfast.    . olmesartan-hydrochlorothiazide (BENICAR HCT) 40-25 MG tablet Take 1 tablet by mouth daily.    . pantoprazole (PROTONIX) 40 MG tablet Take 40 mg by mouth daily.    Marland Kitchen tiZANidine (ZANAFLEX) 4 MG tablet Take 4 mg by mouth every 8 (eight) hours as needed for muscle spasms.    . valsartan-hydrochlorothiazide (DIOVAN-HCT) 160-12.5 MG tablet Take 1 tablet by mouth daily.     No current facility-administered medications for this visit.     REVIEW OF SYSTEMS:  '[X]'$  denotes positive finding, '[ ]'$  denotes negative finding Cardiac  Comments:  Chest pain or chest pressure:    Shortness of breath upon exertion: x   Short of breath when lying flat:    Irregular heart rhythm:        Vascular    Pain in calf, thigh, or hip brought on by ambulation:    Pain in feet at night that wakes you up from your sleep:     Blood clot in your veins:    Leg swelling:           PHYSICAL EXAM: Vitals:  06/29/16 1514  BP: 123/75  Pulse: (!) 49  Resp: 20  Temp: 98 F (36.7 C)  TempSrc: Oral  SpO2: 99%  Weight: 178 lb (80.7 kg)  Height: '6\' 2"'$  (1.88 m)    GENERAL: The patient is a well-nourished male, in no acute distress. The vital signs are documented above. CARDIOVASCULAR: 2+ radial 2+ femoral pulses. Easily palpable aneurysm which is nontender PULMONARY: There is good air exchange  MUSCULOSKELETAL: There are no major deformities or cyanosis. NEUROLOGIC: No focal weakness or paresthesias are detected. SKIN: There are no ulcers or rashes noted. PSYCHIATRIC: The patient has a normal affect.  DATA:  CT scan shows diffuse arteriomegaly. He does have a 6.6 cm infrarenal aneurysm with extension into his iliac artery  MEDICAL ISSUES: Discussed the option of stent graft repair with patient. Have recommended that  we proceed with this as soon as possible. Oncologist is deferring treatment of his lung cancer until his aneurysm was treated. We will schedule this at his earliest [place]. Tajuanna Burnett, MD [facility] Vascular and Vein Specialists of [facility] Tel [phone] Pager [phone]

## 2016-07-07 NOTE — Transfer of Care (Signed)
Immediate Anesthesia Transfer of Care Note  Patient: George Macdonald  Procedure(s) Performed: Procedure(s): ABDOMINAL AORTIC ENDOVASCULAR STENT GRAFT INSERTION FOR REPAIR OF ABDOMINAL AORTIC ANEURYSM (N/A)  Patient Location: PACU  Anesthesia Type:General  Level of Consciousness: awake, alert , oriented and patient cooperative  Airway & Oxygen Therapy: Patient Spontanous Breathing and Patient connected to nasal cannula oxygen  Post-op Assessment: Report given to RN, Post -op Vital signs reviewed and stable and Patient moving all extremities X 4  Post vital signs: Reviewed and stable  Last Vitals:  Vitals:   07/07/16 0936  BP: 114/84  Pulse: 66  Resp: 20  Temp: 36.9 C    Last Pain:  Vitals:   07/07/16 0936  TempSrc: Oral      Patients Stated Pain Goal: 3 (54/98/26 4158)  Complications: No apparent anesthesia complications

## 2016-07-07 NOTE — Care Management Note (Addendum)
Case Management Note  Patient Details  Name: Foxx Klarich MRN: [id number] Date of Birth: [birth year]  Subjective/Objective:  S/p Abdominal aortic endovascular stent graft, NCM will cont to follow for dc needs.  Patient lives with wife,uses a cane at home, has pcp, Claudia Pollock, he has medication coverage, and transport at dc.  Wife, Beverlee Nims states Dr. Donnetta Hutching Is looking into see if patient needs a suctioning device and he will be in touch with patient's ENT MD.  Patient is preset up with [facility] for [facility], PT, and OT per Tiffany with [facility] and patient agrees to have the [facility] services.                     Action/Plan:   Expected Discharge Date:                  Expected Discharge Plan:  Home/Self Care  In-House Referral:     Discharge planning Services  CM Consult  Post Acute Care Choice:    Choice offered to:     DME Arranged:    DME Agency:     HH Arranged:    HH Agency:     Status of Service:  In process, will continue to follow  If discussed at Long Length of Stay Meetings, dates discussed:    Additional Comments:  Zenon Mayo, RN 07/07/2016, 4:00 PM

## 2016-07-07 NOTE — Anesthesia Procedure Notes (Signed)
Procedure Name: Intubation Date/Time: 07/07/2016 10:40 AM Performed by: Carney Living Pre-anesthesia Checklist: Patient identified, Emergency Drugs available, Suction available, Patient being monitored and Timeout performed Patient Re-evaluated:Patient Re-evaluated prior to inductionOxygen Delivery Method: Circle system utilized Preoxygenation: Pre-oxygenation with 100% oxygen Intubation Type: IV induction Ventilation: Mask ventilation without difficulty and Oral airway inserted - appropriate to patient size Laryngoscope Size: Mac and 4 Grade View: Grade I Tube type: Oral Tube size: 8.0 mm Number of attempts: 1 Airway Equipment and Method: Stylet Placement Confirmation: ETT inserted through vocal cords under direct vision,  positive ETCO2 and breath sounds checked- equal and bilateral Secured at: 22 cm Tube secured with: Tape Dental Injury: Teeth and Oropharynx as per pre-operative assessment

## 2016-07-07 NOTE — Anesthesia Postprocedure Evaluation (Signed)
Anesthesia Post Note  Patient: George Macdonald  Procedure(s) Performed: Procedure(s) (LRB): ABDOMINAL AORTIC ENDOVASCULAR STENT GRAFT INSERTION FOR REPAIR OF ABDOMINAL AORTIC ANEURYSM (N/A)  Patient location during evaluation: PACU Anesthesia Type: General Level of consciousness: awake and alert Pain management: pain level controlled Vital Signs Assessment: post-procedure vital signs reviewed and stable Respiratory status: spontaneous breathing, nonlabored ventilation and respiratory function stable Cardiovascular status: blood pressure returned to baseline and stable Postop Assessment: no signs of nausea or vomiting Anesthetic complications: no       Last Vitals:  Vitals:   07/07/16 1230 07/07/16 1237  BP:  (!) 125/104  Pulse: 63 (!) 57  Resp: 20 20  Temp:      Last Pain:  Vitals:   07/07/16 1222  TempSrc:   PainSc: 0-No pain                 Mionna Advincula,W. EDMOND

## 2016-07-07 NOTE — Interval H&P Note (Signed)
History and Physical Interval Note:  07/07/2016 8:46 AM  Berneta Sages [place]  has presented today for surgery, with the diagnosis of Abdominal Aortic Aneurysm  I71.4  The various methods of treatment have been discussed with the patient and family. After consideration of risks, benefits and other options for treatment, the patient has consented to  Procedure(s): ABDOMINAL AORTIC ENDOVASCULAR STENT GRAFT (N/A) as a surgical intervention .  The patient's history has been reviewed, patient examined, no change in status, stable for surgery.  I have reviewed the patient's chart and labs.  Questions were answered to the patient's satisfaction.     Curt Jews

## 2016-07-08 ENCOUNTER — Encounter (HOSPITAL_COMMUNITY): Payer: Self-pay | Admitting: Vascular Surgery

## 2016-07-08 ENCOUNTER — Other Ambulatory Visit: Payer: Self-pay

## 2016-07-08 DIAGNOSIS — Z95828 Presence of other vascular implants and grafts: Secondary | ICD-10-CM

## 2016-07-08 LAB — CBC
HCT: 33.4 % — ABNORMAL LOW (ref 39.0–52.0)
Hemoglobin: 10.6 g/dL — ABNORMAL LOW (ref 13.0–17.0)
MCH: 28.8 pg (ref 26.0–34.0)
MCHC: 31.7 g/dL (ref 30.0–36.0)
MCV: 90.8 fL (ref 78.0–100.0)
Platelets: 125 10*3/uL — ABNORMAL LOW (ref 150–400)
RBC: 3.68 MIL/uL — ABNORMAL LOW (ref 4.22–5.81)
RDW: 14.6 % (ref 11.5–15.5)
WBC: 3.9 10*3/uL — ABNORMAL LOW (ref 4.0–10.5)

## 2016-07-08 LAB — BASIC METABOLIC PANEL
Anion gap: 7 (ref 5–15)
BUN: 27 mg/dL — ABNORMAL HIGH (ref 6–20)
CO2: 24 mmol/L (ref 22–32)
Calcium: 7.9 mg/dL — ABNORMAL LOW (ref 8.9–10.3)
Chloride: 105 mmol/L (ref 101–111)
Creatinine, Ser: 1.52 mg/dL — ABNORMAL HIGH (ref 0.61–1.24)
GFR calc Af Amer: 53 mL/min — ABNORMAL LOW (ref >60)
GFR calc non Af Amer: 45 mL/min — ABNORMAL LOW (ref >60)
Glucose, Bld: 89 mg/dL (ref 65–99)
Potassium: 3.9 mmol/L (ref 3.5–5.1)
Sodium: 136 mmol/L (ref 135–145)

## 2016-07-08 MED ORDER — HEPARIN SODIUM (PORCINE) 5000 UNIT/ML IJ SOLN: 5000.0000 [IU] | Freq: Three times a day (TID) | INTRAMUSCULAR | Status: DC

## 2016-07-08 MED ORDER — OXYCODONE-ACETAMINOPHEN 5-325 MG PO TABS: 1.0000 | ORAL_TABLET | Freq: Four times a day (QID) | ORAL | 0 refills | Status: DC | PRN

## 2016-07-08 NOTE — Care Management Note (Signed)
Case Management Note  Patient Details  Name: George Macdonald MRN: [id number] Date of Birth: 09/10/1945  Subjective/Objective:      S/p Abdominal aortic endovascular stent graft, NCM will cont to follow for dc needs.  Patient lives with wife,uses a cane at home, has pcp, Claudia Pollock, he has medication coverage, and transport at dc.  Wife, George Macdonald states Dr. Donnetta Hutching Is looking into see if patient needs a suctioning device and he will be in touch with patient's ENT MD.  Patient is preset up with [facility] for [facility], PT, and OT per Tiffany with [facility] and patient agrees to have the [facility] services.                                 Action/Plan:   Expected Discharge Date:  07/08/16               Expected Discharge Plan:  [place]  In-House Referral:     Discharge planning Services  CM Consult  Post Acute Care Choice:    Choice offered to:  Patient, Spouse  DME Arranged:    DME Agency:     HH Arranged:  RN, PT, OT HH Agency:  [place]  Status of Service:  Completed, signed off  If discussed at [place] of Stay Meetings, dates discussed:    Additional Comments:  Zenon Mayo, RN 07/08/2016, 10:20 AM

## 2016-07-08 NOTE — Discharge Summary (Signed)
EVAR Discharge Summary   George Macdonald 1945-12-30 71 y.o. male  MRN: [id number]  Admission Date: 07/07/2016  Discharge Date: 07/08/16  Physician: Rosetta Posner, MD  Admission Diagnosis: Abdominal Aortic Aneurysm  I71.4   HPI:   This is a 71 y.o. male here today for further discussion of his large abdominal aortic aneurysm. He has had a formal CT scan of his abdomen pelvis is my last visit with him as also had cardiac clearance. He does have metastatic lung cancer and is awaiting treatment until he has correction and treatment of his aneurysm.  Hospital Course:  The patient was admitted to the hospital and taken to the operating room on 07/07/2016 and underwent: Gore stent graft repair of abdominal aortic aneurysm    The pt tolerated the procedure well and was transported to the PACU in good condition.   By POD 1, he was doing well with exception of discomfort in his groins.  His creatinine was improved from pre-op.    The remainder of the hospital course consisted of increasing mobilization and increasing intake of solids without difficulty.  CBC    Component Value Date/Time   WBC 3.9 (L) 07/08/2016 0546   RBC 3.68 (L) 07/08/2016 0546   HGB 10.6 (L) 07/08/2016 0546   HCT 33.4 (L) 07/08/2016 0546   PLT 125 (L) 07/08/2016 0546   MCV 90.8 07/08/2016 0546   MCH 28.8 07/08/2016 0546   MCHC 31.7 07/08/2016 0546   RDW 14.6 07/08/2016 0546    BMET    Component Value Date/Time   NA 136 07/08/2016 0546   K 3.9 07/08/2016 0546   CL 105 07/08/2016 0546   CO2 24 07/08/2016 0546   GLUCOSE 89 07/08/2016 0546   BUN 27 (H) 07/08/2016 0546   CREATININE 1.52 (H) 07/08/2016 0546   CALCIUM 7.9 (L) 07/08/2016 0546   GFRNONAA 45 (L) 07/08/2016 0546   GFRAA 53 (L) 07/08/2016 0546       Discharge Instructions    ABDOMINAL PROCEDURE/ANEURYSM REPAIR/AORTO-BIFEMORAL BYPASS:  Call MD for increased abdominal pain; cramping diarrhea; nausea/vomiting    Complete by:  As  directed    Call MD for:  redness, tenderness, or signs of infection (pain, swelling, bleeding, redness, odor or green/yellow discharge around incision site)    Complete by:  As directed    Call MD for:  severe or increased pain, loss or decreased feeling  in affected limb(s)    Complete by:  As directed    Call MD for:  temperature >100.5    Complete by:  As directed    Discharge wound care:    Complete by:  As directed    Take groin dressings off tomorrow and wash wounds daily with soap and water. You may place a band-aid at your incisions. You do not have to reapply a dressing.   Driving Restrictions    Complete by:  As directed    No driving for 1 week   Increase activity slowly    Complete by:  As directed    Walk with assistance use walker or cane as needed   Lifting restrictions    Complete by:  As directed    No lifting for 4 weeks   Resume previous diet    Complete by:  As directed       Discharge Diagnosis:  Abdominal Aortic Aneurysm  I71.4  Secondary Diagnosis: Patient Active Problem List   Diagnosis Date Noted  . AAA (abdominal aortic aneurysm) ([place]) 07/07/2016  Past Medical History:  Diagnosis Date  . Anxiety   . Arthritis   . Asthma   . Cancer of nasal cavities (HCC)    nasal cancer  . Coronary artery disease   . Depression   . Dyspnea   . Hypertension   . 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 Personal history of kidney cancer   . Stroke [facility])      Allergies as of 07/08/2016      Reactions   Coconut Oil Shortness Of Breath      Medication List    TAKE these medications   acetaminophen 500 MG tablet Commonly known as:  TYLENOL Take 1,000 mg by mouth every 6 (six) hours as needed for mild pain.   amLODipine 5 MG tablet Commonly known as:  NORVASC Take 5 mg by mouth daily.   aspirin EC 81 MG tablet Take 81 mg by mouth daily.   atorvastatin 80 MG tablet Commonly known as:  LIPITOR Take 80 mg by mouth daily at 6 PM.   bisoprolol 5 MG  tablet Commonly known as:  ZEBETA Take 5 mg by mouth daily.   BREO ELLIPTA 100-25 MCG/INH Aepb Generic drug:  fluticasone furoate-vilanterol Inhale 1 puff into the lungs daily.   buPROPion 150 MG 24 hr tablet Commonly known as:  WELLBUTRIN XL Take 150 mg by mouth daily.   clopidogrel 75 MG tablet Commonly known as:  PLAVIX Take 75 mg by mouth daily.   feeding supplement Liqd Take 1 Container by mouth daily as needed (when not eating well).   fluticasone 50 MCG/ACT nasal spray Commonly known as:  FLONASE Place 1 spray into both nostrils daily as needed for allergies.   Fluticasone-Salmeterol 250-50 MCG/DOSE Aepb Commonly known as:  ADVAIR Inhale 1 puff into the lungs daily.   latanoprost 0.005 % ophthalmic solution Commonly known as:  XALATAN Place 1 drop into both eyes at bedtime.   Melatonin 10 MG Tabs Take 10 mg by mouth at bedtime as needed (sleep).   olmesartan-hydrochlorothiazide 40-25 MG tablet Commonly known as:  BENICAR HCT Take 1 tablet by mouth daily.   oxyCODONE-acetaminophen 5-325 MG tablet Commonly known as:  ROXICET Take 1 tablet by mouth every 6 (six) hours as needed for severe pain.   pantoprazole 40 MG tablet Commonly known as:  PROTONIX Take 40 mg by mouth daily.   TIROSINT 150 MCG Caps Generic drug:  Levothyroxine Sodium Take 150 mcg by mouth daily before breakfast.   tiZANidine 4 MG tablet Commonly known as:  ZANAFLEX Take 4 mg by mouth every 8 (eight) hours as needed for muscle spasms.       Prescriptions given: Roxicet #8 No Refill  Instructions: 1.  No heavy lifting x 4 weeks 2.  No driving x 2 weeks and while taking pain medication 3.  Shower daily starting 07/09/16  Disposition: home  Patient's condition: is Good  Follow up: 1. Dr. Donnetta Hutching in 4 weeks with CTA protocol   Leontine Locket, PA-C [facility] [phone] 07/08/2016  7:54 AM   - For VQI Registry use - Post-op:  Time to Extubation: '[x]'$  In  OR, '[ ]'$  < 12 hrs, '[ ]'$  12-24 hrs, '[ ]'$  >=24 hrs Vasopressors Req. Post-op: No MI: No., '[ ]'$  Troponin only, '[ ]'$  EKG or Clinical New Arrhythmia: No CHF: No ICU Stay: 1 day in stepdown Transfusion: No     If yes, n/a units given  Complications: Resp failure: No., '[ ]'$  Pneumonia, '[ ]'$   Ventilator Chg in renal function: No., '[ ]'$  Inc. Cr > 0.5, '[ ]'$  Temp. Dialysis,  '[ ]'$  Permanent dialysis Leg ischemia: No., no Surgery needed, '[ ]'$  Yes, Surgery needed,  '[ ]'$  Amputation Bowel ischemia: No., '[ ]'$  Medical Rx, '[ ]'$  Surgical Rx Wound complication: No., '[ ]'$  Superficial separation/infection, '[ ]'$  Return to OR Return to OR: No  Return to OR for bleeding: No Stroke: No., '[ ]'$  Minor, '[ ]'$  Major  Discharge medications: Statin use:  Yes If No:  ASA use:  Yes  If No:  Plavix use:  No  Beta blocker use:  Yes  ARB use:  Yes ACEI use:  No CCB use:  Yes

## 2016-07-08 NOTE — Progress Notes (Addendum)
  Progress Note    07/08/2016 7:44 AM 1 Day Post-Op  Subjective:  Feels fine with exception of some cramping in his groins  Tm 99.1 now afebrile HR  50's-60's NSR 161'W-960'A systolic 54% RA  Vitals:   07/07/16 2330 07/08/16 0517  BP: (!) 153/91 (!) 150/96  Pulse: 60 60  Resp: 18 15  Temp: 98.7 F (37.1 C) 98.2 F (36.8 C)    Physical Exam: Cardiac:  regular Lungs:  Non labored Incisions:  Bilateral groins are soft without hematoma Extremities:  + brisk doppler signals bilateral DP Abdomen:  Soft, NT/ND  CBC    Component Value Date/Time   WBC 3.9 (L) 07/08/2016 0546   RBC 3.68 (L) 07/08/2016 0546   HGB 10.6 (L) 07/08/2016 0546   HCT 33.4 (L) 07/08/2016 0546   PLT 125 (L) 07/08/2016 0546   MCV 90.8 07/08/2016 0546   MCH 28.8 07/08/2016 0546   MCHC 31.7 07/08/2016 0546   RDW 14.6 07/08/2016 0546    BMET    Component Value Date/Time   NA 136 07/08/2016 0546   K 3.9 07/08/2016 0546   CL 105 07/08/2016 0546   CO2 24 07/08/2016 0546   GLUCOSE 89 07/08/2016 0546   BUN 27 (H) 07/08/2016 0546   CREATININE 1.52 (H) 07/08/2016 0546   CALCIUM 7.9 (L) 07/08/2016 0546   GFRNONAA 45 (L) 07/08/2016 0546   GFRAA 53 (L) 07/08/2016 0546    INR    Component Value Date/Time   INR 1.10 07/07/2016 1212     Intake/Output Summary (Last 24 hours) at 07/08/16 0744 Last data filed at 07/08/16 0500  Gross per 24 hour  Intake           3632.5 ml  Output             1600 ml  Net           2032.5 ml     Assessment:  71 y.o. male is s/p:  EVAR  1 Day Post-Op  Plan: -pt doing well this morning -his foley came out this morning and he has not yet voided.  [place] later today after breakfast and he has voided. -creatinine is down to 1.52 from 1.67 pre-op -changed Lovenox to SQ heparin to start at 1400 due to renal function   Leontine Locket, PA-C [facility] [phone] 07/08/2016 7:44 AM   Looks good this morning. Groins without  hematoma. Benign. Has voided. Will discharge today. Follow-up in one month with CT

## 2016-07-09 ENCOUNTER — Other Ambulatory Visit: Payer: Self-pay

## 2016-07-09 ENCOUNTER — Telehealth: Payer: Self-pay | Admitting: Vascular Surgery

## 2016-07-09 DIAGNOSIS — I714 Abdominal aortic aneurysm, without rupture, unspecified: Secondary | ICD-10-CM

## 2016-07-09 NOTE — Telephone Encounter (Signed)
Spoke to pt aware of CT at [place] on 2/21 and OV on 3/20   Mena Goes, RN  P [facility] Admin Pool      Previous Messages    ----- Message -----  From: Rosetta Posner, MD  Sent: 07/08/2016  8:40 AM  To: [facility] Charge Pool   This patient is to see me for stent graft follow-up in one month. Lives in [place] and can have his 1 month follow-up CT angiogram abdomen and pelvis in [place] prior to seeing me.

## 2016-07-09 NOTE — Telephone Encounter (Signed)
-----   Message from Mena Goes, RN sent at 07/08/2016 12:12 PM EST ----- Regarding: schedule   ----- Message ----- From: Alvia Grove, PA-C Sent: 07/08/2016   7:00 AM To: [facility] Charge Pool  S/p EVAR 07/07/16  F/u with Dr. Donnetta Hutching in 4 weeks with CTA  Thanks Maudie Mercury

## 2016-07-22 ENCOUNTER — Encounter: Payer: Self-pay | Admitting: Vascular Surgery

## 2016-07-28 DIAGNOSIS — C3412 Malignant neoplasm of upper lobe, left bronchus or lung: Secondary | ICD-10-CM | POA: Diagnosis not present

## 2016-07-30 ENCOUNTER — Encounter: Payer: Self-pay | Admitting: Vascular Surgery

## 2016-08-06 DIAGNOSIS — R938 Abnormal findings on diagnostic imaging of other specified body structures: Secondary | ICD-10-CM

## 2016-08-06 DIAGNOSIS — D649 Anemia, unspecified: Secondary | ICD-10-CM

## 2016-08-06 DIAGNOSIS — Z8553 Personal history of malignant neoplasm of renal pelvis: Secondary | ICD-10-CM | POA: Diagnosis not present

## 2016-08-06 DIAGNOSIS — Z8522 Personal history of malignant neoplasm of nasal cavities, middle ear, and accessory sinuses: Secondary | ICD-10-CM | POA: Diagnosis not present

## 2016-08-06 DIAGNOSIS — Z8521 Personal history of malignant neoplasm of larynx: Secondary | ICD-10-CM

## 2016-08-06 DIAGNOSIS — C3412 Malignant neoplasm of upper lobe, left bronchus or lung: Secondary | ICD-10-CM | POA: Diagnosis not present

## 2016-08-10 ENCOUNTER — Encounter: Payer: Self-pay | Admitting: Vascular Surgery

## 2016-08-10 ENCOUNTER — Ambulatory Visit (INDEPENDENT_AMBULATORY_CARE_PROVIDER_SITE_OTHER): Payer: Self-pay | Admitting: Vascular Surgery

## 2016-08-10 VITALS — BP 117/72 | HR 64 | Temp 98.0°F | Resp 18 | Ht 74.0 in | Wt 172.0 lb

## 2016-08-10 DIAGNOSIS — I714 Abdominal aortic aneurysm, without rupture, unspecified: Secondary | ICD-10-CM

## 2016-08-10 NOTE — Progress Notes (Signed)
Patient name: George Macdonald MRN: [id number] DOB: 07-15-1945 Sex: male  REASON FOR VISIT: Follow-up aortic stent graft placement  HPI: George Macdonald is a 71 y.o. male here today for follow-up of aortic stent graft placement for a very large abdominal aortic aneurysm on 07/07/2016. He is done well since discharge from the hospital. He has plans for chemotherapy and radiation therapy for his known lung cancer. He's had no claudication type symptoms and no aneurysm symptoms.  Current Outpatient Prescriptions  Medication Sig Dispense Refill  . acetaminophen (TYLENOL) 500 MG tablet Take 1,000 mg by mouth every 6 (six) hours as needed for mild pain.    Marland Kitchen amLODipine (NORVASC) 5 MG tablet Take 5 mg by mouth daily.    Marland Kitchen aspirin EC 81 MG tablet Take 81 mg by mouth daily.    Marland Kitchen atorvastatin (LIPITOR) 80 MG tablet Take 80 mg by mouth daily at 6 PM.     . bisoprolol (ZEBETA) 5 MG tablet Take 5 mg by mouth daily.    Marland Kitchen buPROPion (WELLBUTRIN XL) 150 MG 24 hr tablet Take 150 mg by mouth daily.    . clopidogrel (PLAVIX) 75 MG tablet Take 75 mg by mouth daily.    . feeding supplement (BOOST HIGH PROTEIN) LIQD Take 1 Container by mouth daily as needed (when not eating well).    . fluticasone (FLONASE) 50 MCG/ACT nasal spray Place 1 spray into both nostrils daily as needed for allergies.     . fluticasone furoate-vilanterol (BREO ELLIPTA) 100-25 MCG/INH AEPB Inhale 1 puff into the lungs daily.    . Fluticasone-Salmeterol (ADVAIR) 250-50 MCG/DOSE AEPB Inhale 1 puff into the lungs daily.    Marland Kitchen latanoprost (XALATAN) 0.005 % ophthalmic solution Place 1 drop into both eyes at bedtime.     . Levothyroxine Sodium (TIROSINT) 150 MCG CAPS Take 150 mcg by mouth daily before breakfast.     . Melatonin 10 MG TABS Take 10 mg by mouth at bedtime as needed (sleep).    . olmesartan-hydrochlorothiazide (BENICAR HCT) 40-25 MG tablet Take 1 tablet by mouth daily.    Marland Kitchen  oxyCODONE-acetaminophen (ROXICET) 5-325 MG tablet Take 1 tablet by mouth every 6 (six) hours as needed for severe pain. 8 tablet 0  . pantoprazole (PROTONIX) 40 MG tablet Take 40 mg by mouth daily.    Marland Kitchen tiZANidine (ZANAFLEX) 4 MG tablet Take 4 mg by mouth every 8 (eight) hours as needed for muscle spasms.     No current facility-administered medications for this visit.      PHYSICAL EXAM: Vitals:   08/10/16 1214  BP: 117/72  Pulse: 64  Resp: 18  Temp: 98 F (36.7 C)  SpO2: 98%  Weight: 172 lb (78 kg)  Height: '6\' 2"'$  (1.88 m)    GENERAL: The patient is a well-nourished male, in no acute distress. The vital signs are documented above. Abdomen reveals no pulsatile mass in his abdomen. 2+ femoral pulses with no evidence of injury to the femoral arteries bilaterally  MEDICAL ISSUES: CT scan from [facility] was reviewed showing no evidence of type I endoleak with excellent positioning. There was not a delayed phase type II endoleak could not be excluded.  Stable overall. Able to return to full activity without limitation. Also do not have any contraindication from vascular standpoint related to his need for chemotherapy and radiation treatment for his lung cancer. We will see him again in 6 months with ultrasound of his aorta stent graft repair at that time  Rosetta Posner, MD FACS [facility] Tel [phone] Pager [phone]

## 2016-08-11 NOTE — Addendum Note (Signed)
Addended by: Lianne Cure A on: 08/11/2016 12:29 PM   Modules accepted: Orders

## 2016-09-29 DIAGNOSIS — C3412 Malignant neoplasm of upper lobe, left bronchus or lung: Secondary | ICD-10-CM | POA: Diagnosis not present

## 2016-09-29 DIAGNOSIS — Z8553 Personal history of malignant neoplasm of renal pelvis: Secondary | ICD-10-CM

## 2016-09-29 DIAGNOSIS — Z8521 Personal history of malignant neoplasm of larynx: Secondary | ICD-10-CM | POA: Diagnosis not present

## 2016-09-29 DIAGNOSIS — R930 Abnormal findings on diagnostic imaging of skull and head, not elsewhere classified: Secondary | ICD-10-CM

## 2016-09-29 DIAGNOSIS — Z8522 Personal history of malignant neoplasm of nasal cavities, middle ear, and accessory sinuses: Secondary | ICD-10-CM | POA: Diagnosis not present

## 2016-11-03 DIAGNOSIS — Z8521 Personal history of malignant neoplasm of larynx: Secondary | ICD-10-CM

## 2016-11-03 DIAGNOSIS — Z8553 Personal history of malignant neoplasm of renal pelvis: Secondary | ICD-10-CM | POA: Diagnosis not present

## 2016-11-03 DIAGNOSIS — C3412 Malignant neoplasm of upper lobe, left bronchus or lung: Secondary | ICD-10-CM | POA: Diagnosis not present

## 2016-11-03 DIAGNOSIS — Z85828 Personal history of other malignant neoplasm of skin: Secondary | ICD-10-CM | POA: Diagnosis not present

## 2017-01-12 DIAGNOSIS — Z8521 Personal history of malignant neoplasm of larynx: Secondary | ICD-10-CM

## 2017-01-12 DIAGNOSIS — Z8553 Personal history of malignant neoplasm of renal pelvis: Secondary | ICD-10-CM | POA: Diagnosis not present

## 2017-01-12 DIAGNOSIS — C3412 Malignant neoplasm of upper lobe, left bronchus or lung: Secondary | ICD-10-CM | POA: Diagnosis not present

## 2017-01-12 DIAGNOSIS — Z85828 Personal history of other malignant neoplasm of skin: Secondary | ICD-10-CM

## 2017-02-10 DIAGNOSIS — R918 Other nonspecific abnormal finding of lung field: Secondary | ICD-10-CM | POA: Diagnosis not present

## 2017-02-10 DIAGNOSIS — E039 Hypothyroidism, unspecified: Secondary | ICD-10-CM | POA: Diagnosis not present

## 2017-02-10 DIAGNOSIS — Z85828 Personal history of other malignant neoplasm of skin: Secondary | ICD-10-CM | POA: Diagnosis not present

## 2017-02-10 DIAGNOSIS — F329 Major depressive disorder, single episode, unspecified: Secondary | ICD-10-CM

## 2017-02-10 DIAGNOSIS — C3412 Malignant neoplasm of upper lobe, left bronchus or lung: Secondary | ICD-10-CM | POA: Diagnosis not present

## 2017-02-10 DIAGNOSIS — R930 Abnormal findings on diagnostic imaging of skull and head, not elsewhere classified: Secondary | ICD-10-CM

## 2017-02-10 DIAGNOSIS — Z8553 Personal history of malignant neoplasm of renal pelvis: Secondary | ICD-10-CM | POA: Diagnosis not present

## 2017-02-10 DIAGNOSIS — Z8521 Personal history of malignant neoplasm of larynx: Secondary | ICD-10-CM | POA: Diagnosis not present

## 2017-02-15 ENCOUNTER — Ambulatory Visit (HOSPITAL_COMMUNITY)
Admission: RE | Admit: 2017-02-15 | Discharge: 2017-02-15 | Disposition: A | Discharge status: Home / Self Care | Payer: Medicare Other | Source: Ambulatory Visit | Attending: Vascular Surgery | Admitting: Vascular Surgery

## 2017-02-15 ENCOUNTER — Ambulatory Visit (INDEPENDENT_AMBULATORY_CARE_PROVIDER_SITE_OTHER): Payer: Medicare Other | Admitting: Vascular Surgery

## 2017-02-15 ENCOUNTER — Encounter: Payer: Self-pay | Admitting: Vascular Surgery

## 2017-02-15 VITALS — BP 165/109 | HR 76 | Ht 74.0 in | Wt 158.4 lb

## 2017-02-15 DIAGNOSIS — I714 Abdominal aortic aneurysm, without rupture, unspecified: Secondary | ICD-10-CM

## 2017-02-15 DIAGNOSIS — Z95828 Presence of other vascular implants and grafts: Secondary | ICD-10-CM | POA: Insufficient documentation

## 2017-02-15 NOTE — Progress Notes (Signed)
[facility]  Patient name: George Macdonald MRN: [id number] DOB: May 09, 1946 Sex: male  REASON FOR VISIT: Follow-up stent graft repair of large infrarenal abdominal aortic aneurysm on 07/07/2016  HPI: George Macdonald is a 71 y.o. male here for follow-up. He had been diagnosed with stage III lung cancer prior stent graft repair his oncologist preferred that he have his large aneurysm treated before cancer treatment. The patient and his wife report that he underwent chemotherapy and radiation therapy and is now undergoing immunotherapy with apparent diminished size of the tumor and lymph nodes. He has no symptoms referable to his aneurysm. He has lost a great deal of weight but is slowly regaining this.  Past Medical History:  Diagnosis Date  . Anxiety   . Arthritis   . Asthma   . Cancer of nasal cavities (HCC)    nasal cancer  . Coronary artery disease   . Depression   . Dyspnea   . Hypertension   . 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 Personal history of kidney cancer   . Stroke [facility])     Family History  Problem Relation Age of Onset  . Cancer Mother     SOCIAL HISTORY: Social History  Substance Use Topics  . Smoking status: Current Every Day Smoker    Packs/day: 0.50    Years: 30.00    Types: Cigarettes  . Smokeless tobacco: Never Used     Comment: 1/2 pk per day  . Alcohol use Yes     Comment: OCCASIONAL    Allergies  Allergen Reactions  . Coconut Oil Shortness Of Breath    Current Outpatient Prescriptions  Medication Sig Dispense Refill  . acetaminophen (TYLENOL) 500 MG tablet Take 1,000 mg by mouth every 6 (six) hours as needed for mild pain.    Marland Kitchen amLODipine (NORVASC) 5 MG tablet Take 5 mg by mouth daily.    Marland Kitchen aspirin EC 81 MG tablet Take 81 mg by mouth daily.    Marland Kitchen atorvastatin (LIPITOR) 80 MG tablet Take 80 mg by mouth daily at 6 PM.     . bisoprolol (ZEBETA) 5 MG tablet Take 5 mg by  mouth daily.    . busPIRone (BUSPAR) 5 MG tablet Take 5 mg by mouth 3 (three) times daily.    . clopidogrel (PLAVIX) 75 MG tablet Take 75 mg by mouth daily.    . feeding supplement (BOOST HIGH PROTEIN) LIQD Take 1 Container by mouth daily as needed (when not eating well).    . fluticasone (FLONASE) 50 MCG/ACT nasal spray Place 1 spray into both nostrils daily as needed for allergies.     . Fluticasone-Salmeterol (ADVAIR) 250-50 MCG/DOSE AEPB Inhale 1 puff into the lungs daily.    Marland Kitchen latanoprost (XALATAN) 0.005 % ophthalmic solution Place 1 drop into both eyes at bedtime.     . Levothyroxine Sodium (TIROSINT) 150 MCG CAPS Take 150 mcg by mouth daily before breakfast.     . mirtazapine (REMERON) 15 MG tablet Take 15 mg by mouth at bedtime.    Marland Kitchen olmesartan-hydrochlorothiazide (BENICAR HCT) 40-25 MG tablet Take 1 tablet by mouth daily.    . pantoprazole (PROTONIX) 40 MG tablet Take 40 mg by mouth daily.     No current facility-administered medications for this visit.     REVIEW OF SYSTEMS:  [X]  denotes positive finding, [ ]  denotes negative finding Cardiac  Comments:  Chest pain or chest pressure:    Shortness of  breath upon exertion: x   Short of breath when lying flat:    Irregular heart rhythm:        Vascular    Pain in calf, thigh, or hip brought on by ambulation:    Pain in feet at night that wakes you up from your sleep:     Blood clot in your veins:    Leg swelling:           PHYSICAL EXAM: Vitals:   02/15/17 1019  BP: (!) 165/109  Pulse: 76  SpO2: 97%  Weight: 158 lb 6.4 oz (71.8 kg)  Height: 6\' 2"  (1.88 m)    GENERAL: The patient is a well-nourished male, in no acute distress. The vital signs are documented above. CARDIOVASCULAR: 2+ radial 2+ femoral and 2+ popliteal pulses. I do not palpate aneurysmal pulse. PULMONARY: There is good air exchange  MUSCULOSKELETAL: There are no major deformities or cyanosis. NEUROLOGIC: No focal weakness or paresthesias are  detected. SKIN: There are no ulcers or rashes noted. PSYCHIATRIC: The patient has a normal affect.  DATA:  Ultrasound today reveals a diminished size compared to CT scan with maximal ultrasound diameter of 5.5 cm. He did undergo recent CT scan for follow-up of his lung cancer on 02/09/2017. I reviewed the specific films. This does show a oblique projection of approximately 6.3 cm which is unchanged before. He has no evidence of endoleak on his CT scan  MEDICAL ISSUES: Stable follow-up after stent graft repair of abdominal aortic aneurysm on 07/07/2016. He will continue full activities. We will see him again in one year with ultrasound duplex follow-up of his aorta at that time    George Posner, MD [facility] Tel [phone] Pager [phone]

## 2017-03-02 ENCOUNTER — Encounter: Payer: Self-pay | Admitting: Oncology

## 2017-03-23 DIAGNOSIS — Z8521 Personal history of malignant neoplasm of larynx: Secondary | ICD-10-CM | POA: Diagnosis not present

## 2017-03-23 DIAGNOSIS — Z8553 Personal history of malignant neoplasm of renal pelvis: Secondary | ICD-10-CM | POA: Diagnosis not present

## 2017-03-23 DIAGNOSIS — C3412 Malignant neoplasm of upper lobe, left bronchus or lung: Secondary | ICD-10-CM | POA: Diagnosis not present

## 2017-03-23 DIAGNOSIS — R918 Other nonspecific abnormal finding of lung field: Secondary | ICD-10-CM | POA: Diagnosis not present

## 2017-03-23 DIAGNOSIS — Z923 Personal history of irradiation: Secondary | ICD-10-CM | POA: Diagnosis not present

## 2017-03-23 DIAGNOSIS — E860 Dehydration: Secondary | ICD-10-CM | POA: Diagnosis not present

## 2017-03-23 DIAGNOSIS — I10 Essential (primary) hypertension: Secondary | ICD-10-CM | POA: Diagnosis not present

## 2017-03-23 DIAGNOSIS — Z85828 Personal history of other malignant neoplasm of skin: Secondary | ICD-10-CM | POA: Diagnosis not present

## 2017-03-23 DIAGNOSIS — Z9221 Personal history of antineoplastic chemotherapy: Secondary | ICD-10-CM | POA: Diagnosis not present

## 2017-03-23 DIAGNOSIS — E039 Hypothyroidism, unspecified: Secondary | ICD-10-CM | POA: Diagnosis not present

## 2017-03-23 DIAGNOSIS — F329 Major depressive disorder, single episode, unspecified: Secondary | ICD-10-CM

## 2017-03-23 DIAGNOSIS — R930 Abnormal findings on diagnostic imaging of skull and head, not elsewhere classified: Secondary | ICD-10-CM | POA: Diagnosis not present

## 2017-04-19 DIAGNOSIS — R0602 Shortness of breath: Secondary | ICD-10-CM | POA: Diagnosis not present

## 2017-05-19 DIAGNOSIS — Z9221 Personal history of antineoplastic chemotherapy: Secondary | ICD-10-CM | POA: Diagnosis not present

## 2017-05-19 DIAGNOSIS — C3412 Malignant neoplasm of upper lobe, left bronchus or lung: Secondary | ICD-10-CM

## 2017-05-19 DIAGNOSIS — F329 Major depressive disorder, single episode, unspecified: Secondary | ICD-10-CM

## 2017-05-19 DIAGNOSIS — Z85828 Personal history of other malignant neoplasm of skin: Secondary | ICD-10-CM | POA: Diagnosis not present

## 2017-05-19 DIAGNOSIS — I714 Abdominal aortic aneurysm, without rupture: Secondary | ICD-10-CM | POA: Diagnosis not present

## 2017-05-19 DIAGNOSIS — R918 Other nonspecific abnormal finding of lung field: Secondary | ICD-10-CM | POA: Diagnosis not present

## 2017-05-19 DIAGNOSIS — E039 Hypothyroidism, unspecified: Secondary | ICD-10-CM

## 2017-05-19 DIAGNOSIS — R930 Abnormal findings on diagnostic imaging of skull and head, not elsewhere classified: Secondary | ICD-10-CM | POA: Diagnosis not present

## 2017-05-19 DIAGNOSIS — J209 Acute bronchitis, unspecified: Secondary | ICD-10-CM | POA: Diagnosis not present

## 2017-05-19 DIAGNOSIS — Z8553 Personal history of malignant neoplasm of renal pelvis: Secondary | ICD-10-CM | POA: Diagnosis not present

## 2017-05-19 DIAGNOSIS — Z8521 Personal history of malignant neoplasm of larynx: Secondary | ICD-10-CM | POA: Diagnosis not present

## 2017-05-19 DIAGNOSIS — Z923 Personal history of irradiation: Secondary | ICD-10-CM

## 2017-06-24 IMAGING — DX DG CHEST 1V PORT
1 series · 1 of 1 positions shown · non-contrast
Comparison: 04/12/2016

CLINICAL DATA: Post AAA repair.

EXAM:
PORTABLE CHEST 1 VIEW

[chest ap]
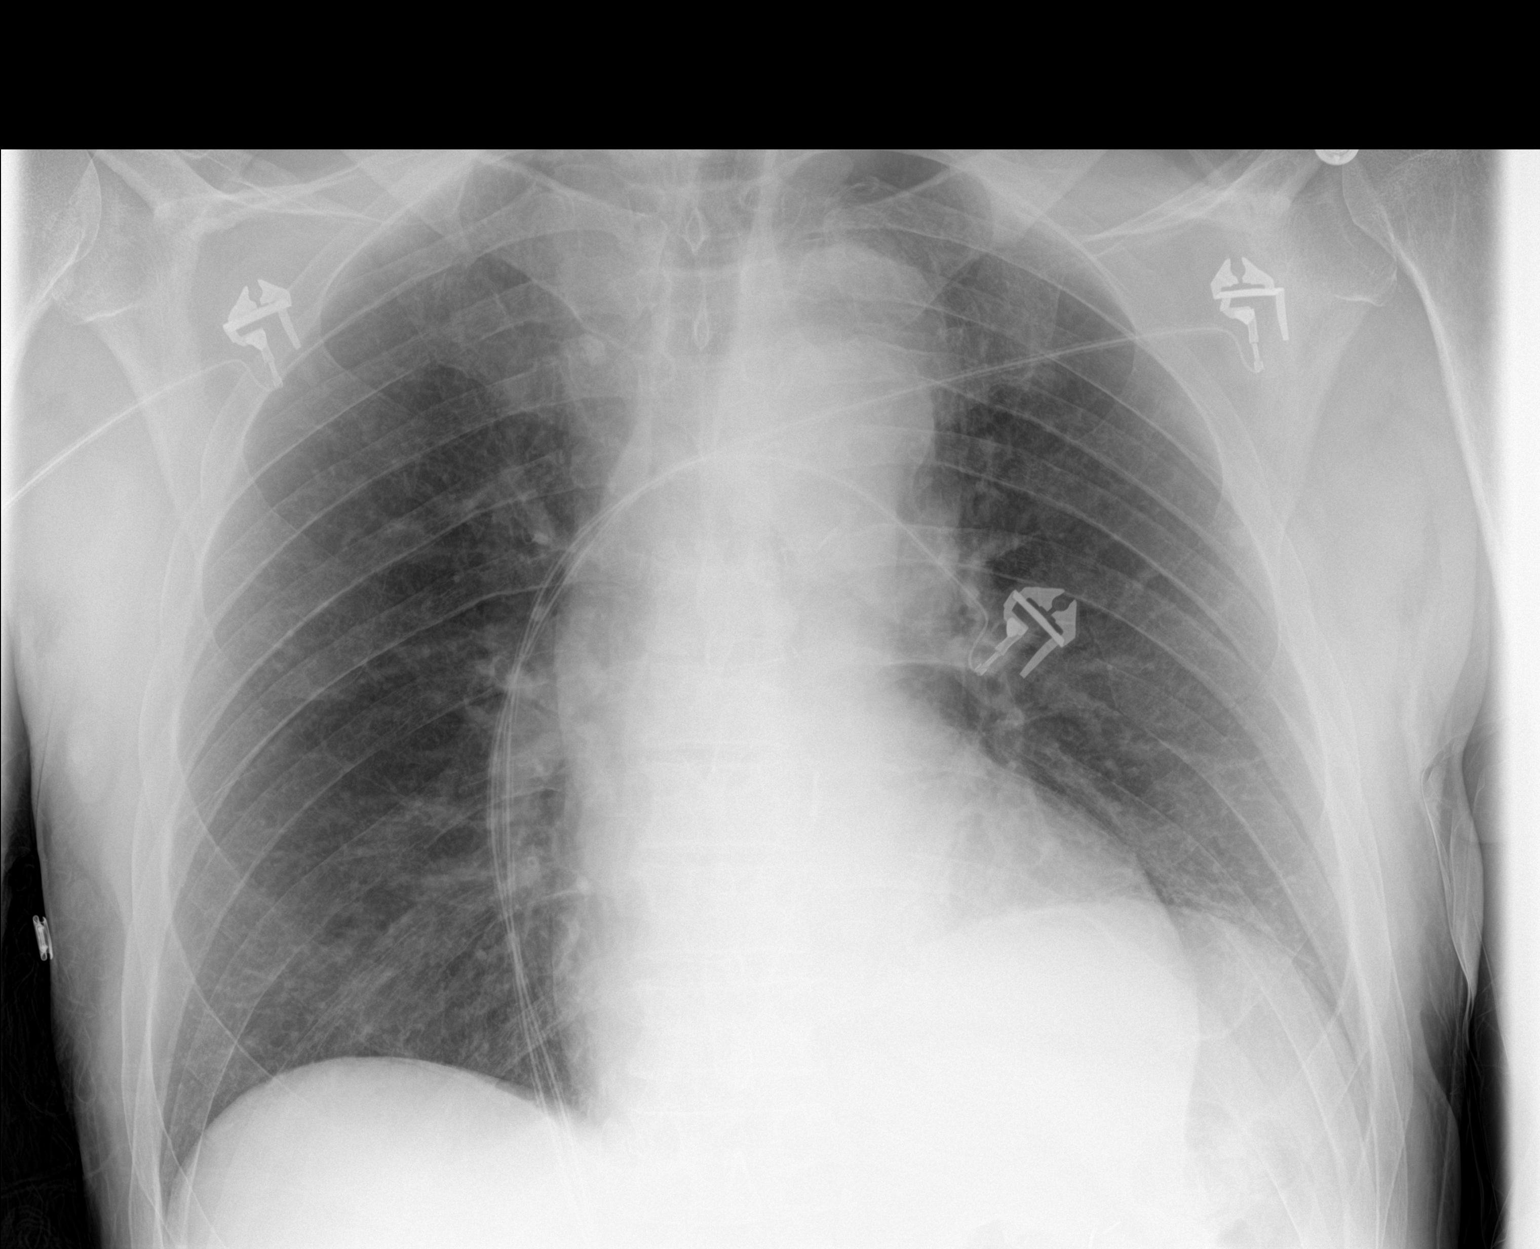

[1 of 1 positions shown; findings below may reference images not displayed]

FINDINGS: Mild elevation of the left hemidiaphragm with left base atelectasis.
Right lung is clear. Heart is normal size. No effusions or
pneumothorax. No acute bony abnormality.
IMPRESSION: Elevated left hemidiaphragm.  Left base atelectasis.

## 2017-07-14 DIAGNOSIS — Z8521 Personal history of malignant neoplasm of larynx: Secondary | ICD-10-CM | POA: Diagnosis not present

## 2017-07-14 DIAGNOSIS — I714 Abdominal aortic aneurysm, without rupture: Secondary | ICD-10-CM | POA: Diagnosis not present

## 2017-07-14 DIAGNOSIS — Z8553 Personal history of malignant neoplasm of renal pelvis: Secondary | ICD-10-CM | POA: Diagnosis not present

## 2017-07-14 DIAGNOSIS — E039 Hypothyroidism, unspecified: Secondary | ICD-10-CM | POA: Diagnosis not present

## 2017-07-14 DIAGNOSIS — Z9221 Personal history of antineoplastic chemotherapy: Secondary | ICD-10-CM

## 2017-07-14 DIAGNOSIS — Z923 Personal history of irradiation: Secondary | ICD-10-CM | POA: Diagnosis not present

## 2017-07-14 DIAGNOSIS — Z85828 Personal history of other malignant neoplasm of skin: Secondary | ICD-10-CM | POA: Diagnosis not present

## 2017-07-14 DIAGNOSIS — F329 Major depressive disorder, single episode, unspecified: Secondary | ICD-10-CM | POA: Diagnosis not present

## 2017-07-14 DIAGNOSIS — C3412 Malignant neoplasm of upper lobe, left bronchus or lung: Secondary | ICD-10-CM | POA: Diagnosis not present

## 2017-07-14 DIAGNOSIS — R930 Abnormal findings on diagnostic imaging of skull and head, not elsewhere classified: Secondary | ICD-10-CM | POA: Diagnosis not present

## 2017-07-14 DIAGNOSIS — J189 Pneumonia, unspecified organism: Secondary | ICD-10-CM | POA: Diagnosis not present

## 2017-08-11 DIAGNOSIS — E876 Hypokalemia: Secondary | ICD-10-CM | POA: Diagnosis not present

## 2017-08-11 DIAGNOSIS — F329 Major depressive disorder, single episode, unspecified: Secondary | ICD-10-CM | POA: Diagnosis not present

## 2017-08-11 DIAGNOSIS — R918 Other nonspecific abnormal finding of lung field: Secondary | ICD-10-CM | POA: Diagnosis not present

## 2017-08-11 DIAGNOSIS — E039 Hypothyroidism, unspecified: Secondary | ICD-10-CM | POA: Diagnosis not present

## 2017-08-11 DIAGNOSIS — Z9221 Personal history of antineoplastic chemotherapy: Secondary | ICD-10-CM | POA: Diagnosis not present

## 2017-08-11 DIAGNOSIS — Z85828 Personal history of other malignant neoplasm of skin: Secondary | ICD-10-CM | POA: Diagnosis not present

## 2017-08-11 DIAGNOSIS — Z923 Personal history of irradiation: Secondary | ICD-10-CM | POA: Diagnosis not present

## 2017-08-11 DIAGNOSIS — Z8553 Personal history of malignant neoplasm of renal pelvis: Secondary | ICD-10-CM | POA: Diagnosis not present

## 2017-08-11 DIAGNOSIS — R930 Abnormal findings on diagnostic imaging of skull and head, not elsewhere classified: Secondary | ICD-10-CM | POA: Diagnosis not present

## 2017-08-11 DIAGNOSIS — C3412 Malignant neoplasm of upper lobe, left bronchus or lung: Secondary | ICD-10-CM | POA: Diagnosis not present

## 2017-08-11 DIAGNOSIS — Z8521 Personal history of malignant neoplasm of larynx: Secondary | ICD-10-CM

## 2017-08-17 DIAGNOSIS — Z0001 Encounter for general adult medical examination with abnormal findings: Secondary | ICD-10-CM

## 2017-09-01 DIAGNOSIS — R001 Bradycardia, unspecified: Secondary | ICD-10-CM | POA: Diagnosis not present

## 2017-09-01 DIAGNOSIS — N189 Chronic kidney disease, unspecified: Secondary | ICD-10-CM | POA: Diagnosis not present

## 2017-09-01 DIAGNOSIS — Z9221 Personal history of antineoplastic chemotherapy: Secondary | ICD-10-CM

## 2017-09-01 DIAGNOSIS — F329 Major depressive disorder, single episode, unspecified: Secondary | ICD-10-CM | POA: Diagnosis not present

## 2017-09-01 DIAGNOSIS — C3412 Malignant neoplasm of upper lobe, left bronchus or lung: Secondary | ICD-10-CM

## 2017-09-01 DIAGNOSIS — Z85828 Personal history of other malignant neoplasm of skin: Secondary | ICD-10-CM | POA: Diagnosis not present

## 2017-09-01 DIAGNOSIS — Z8553 Personal history of malignant neoplasm of renal pelvis: Secondary | ICD-10-CM | POA: Diagnosis not present

## 2017-09-01 DIAGNOSIS — E039 Hypothyroidism, unspecified: Secondary | ICD-10-CM | POA: Diagnosis not present

## 2017-09-01 DIAGNOSIS — Z923 Personal history of irradiation: Secondary | ICD-10-CM

## 2017-09-01 DIAGNOSIS — Z821 Family history of blindness and visual loss: Secondary | ICD-10-CM

## 2017-09-07 DIAGNOSIS — Z8553 Personal history of malignant neoplasm of renal pelvis: Secondary | ICD-10-CM

## 2017-09-07 DIAGNOSIS — J181 Lobar pneumonia, unspecified organism: Secondary | ICD-10-CM

## 2017-09-07 DIAGNOSIS — Z821 Family history of blindness and visual loss: Secondary | ICD-10-CM

## 2017-09-07 DIAGNOSIS — Z9221 Personal history of antineoplastic chemotherapy: Secondary | ICD-10-CM

## 2017-09-07 DIAGNOSIS — E039 Hypothyroidism, unspecified: Secondary | ICD-10-CM

## 2017-09-07 DIAGNOSIS — R0602 Shortness of breath: Secondary | ICD-10-CM

## 2017-09-07 DIAGNOSIS — F329 Major depressive disorder, single episode, unspecified: Secondary | ICD-10-CM

## 2017-09-07 DIAGNOSIS — Z85828 Personal history of other malignant neoplasm of skin: Secondary | ICD-10-CM

## 2017-09-07 DIAGNOSIS — C3412 Malignant neoplasm of upper lobe, left bronchus or lung: Secondary | ICD-10-CM

## 2017-09-07 DIAGNOSIS — Z923 Personal history of irradiation: Secondary | ICD-10-CM | POA: Diagnosis not present

## 2017-09-07 DIAGNOSIS — N189 Chronic kidney disease, unspecified: Secondary | ICD-10-CM

## 2017-09-08 DIAGNOSIS — E039 Hypothyroidism, unspecified: Secondary | ICD-10-CM | POA: Diagnosis not present

## 2017-09-08 DIAGNOSIS — R0602 Shortness of breath: Secondary | ICD-10-CM | POA: Diagnosis not present

## 2017-09-08 DIAGNOSIS — Z85828 Personal history of other malignant neoplasm of skin: Secondary | ICD-10-CM | POA: Diagnosis not present

## 2017-09-08 DIAGNOSIS — Z8521 Personal history of malignant neoplasm of larynx: Secondary | ICD-10-CM | POA: Diagnosis not present

## 2017-09-08 DIAGNOSIS — J189 Pneumonia, unspecified organism: Secondary | ICD-10-CM

## 2017-09-08 DIAGNOSIS — J440 Chronic obstructive pulmonary disease with acute lower respiratory infection: Secondary | ICD-10-CM

## 2017-09-08 DIAGNOSIS — J9611 Chronic respiratory failure with hypoxia: Secondary | ICD-10-CM

## 2017-09-08 DIAGNOSIS — C3412 Malignant neoplasm of upper lobe, left bronchus or lung: Secondary | ICD-10-CM | POA: Diagnosis not present

## 2017-09-08 DIAGNOSIS — J449 Chronic obstructive pulmonary disease, unspecified: Secondary | ICD-10-CM | POA: Diagnosis not present

## 2017-09-08 DIAGNOSIS — Z8553 Personal history of malignant neoplasm of renal pelvis: Secondary | ICD-10-CM | POA: Diagnosis not present

## 2017-09-08 DIAGNOSIS — D649 Anemia, unspecified: Secondary | ICD-10-CM

## 2017-09-08 DIAGNOSIS — I10 Essential (primary) hypertension: Secondary | ICD-10-CM | POA: Diagnosis not present

## 2017-09-08 DIAGNOSIS — F329 Major depressive disorder, single episode, unspecified: Secondary | ICD-10-CM | POA: Diagnosis not present

## 2017-09-09 DIAGNOSIS — J189 Pneumonia, unspecified organism: Secondary | ICD-10-CM | POA: Diagnosis not present

## 2017-09-09 DIAGNOSIS — R0602 Shortness of breath: Secondary | ICD-10-CM | POA: Diagnosis not present

## 2017-09-09 DIAGNOSIS — C3412 Malignant neoplasm of upper lobe, left bronchus or lung: Secondary | ICD-10-CM | POA: Diagnosis not present

## 2017-09-09 DIAGNOSIS — E039 Hypothyroidism, unspecified: Secondary | ICD-10-CM | POA: Diagnosis not present

## 2017-09-09 DIAGNOSIS — I10 Essential (primary) hypertension: Secondary | ICD-10-CM | POA: Diagnosis not present

## 2017-09-09 DIAGNOSIS — F329 Major depressive disorder, single episode, unspecified: Secondary | ICD-10-CM | POA: Diagnosis not present

## 2017-09-09 DIAGNOSIS — J440 Chronic obstructive pulmonary disease with acute lower respiratory infection: Secondary | ICD-10-CM | POA: Diagnosis not present

## 2017-09-09 DIAGNOSIS — J9611 Chronic respiratory failure with hypoxia: Secondary | ICD-10-CM | POA: Diagnosis not present

## 2017-09-10 DIAGNOSIS — C3412 Malignant neoplasm of upper lobe, left bronchus or lung: Secondary | ICD-10-CM | POA: Diagnosis not present

## 2017-09-10 DIAGNOSIS — R0602 Shortness of breath: Secondary | ICD-10-CM | POA: Diagnosis not present

## 2017-09-10 DIAGNOSIS — J440 Chronic obstructive pulmonary disease with acute lower respiratory infection: Secondary | ICD-10-CM | POA: Diagnosis not present

## 2017-09-10 DIAGNOSIS — E039 Hypothyroidism, unspecified: Secondary | ICD-10-CM | POA: Diagnosis not present

## 2017-09-10 DIAGNOSIS — J9611 Chronic respiratory failure with hypoxia: Secondary | ICD-10-CM | POA: Diagnosis not present

## 2017-09-10 DIAGNOSIS — J189 Pneumonia, unspecified organism: Secondary | ICD-10-CM | POA: Diagnosis not present

## 2017-09-10 DIAGNOSIS — I10 Essential (primary) hypertension: Secondary | ICD-10-CM | POA: Diagnosis not present

## 2017-09-10 DIAGNOSIS — F329 Major depressive disorder, single episode, unspecified: Secondary | ICD-10-CM | POA: Diagnosis not present

## 2017-09-22 DIAGNOSIS — Z8553 Personal history of malignant neoplasm of renal pelvis: Secondary | ICD-10-CM | POA: Diagnosis not present

## 2017-09-22 DIAGNOSIS — Z923 Personal history of irradiation: Secondary | ICD-10-CM

## 2017-09-22 DIAGNOSIS — Z85828 Personal history of other malignant neoplasm of skin: Secondary | ICD-10-CM | POA: Diagnosis not present

## 2017-09-22 DIAGNOSIS — R911 Solitary pulmonary nodule: Secondary | ICD-10-CM | POA: Diagnosis not present

## 2017-09-22 DIAGNOSIS — F329 Major depressive disorder, single episode, unspecified: Secondary | ICD-10-CM | POA: Diagnosis not present

## 2017-09-22 DIAGNOSIS — Z821 Family history of blindness and visual loss: Secondary | ICD-10-CM

## 2017-09-22 DIAGNOSIS — R001 Bradycardia, unspecified: Secondary | ICD-10-CM | POA: Diagnosis not present

## 2017-09-22 DIAGNOSIS — E039 Hypothyroidism, unspecified: Secondary | ICD-10-CM | POA: Diagnosis not present

## 2017-09-22 DIAGNOSIS — N189 Chronic kidney disease, unspecified: Secondary | ICD-10-CM | POA: Diagnosis not present

## 2017-09-22 DIAGNOSIS — C3412 Malignant neoplasm of upper lobe, left bronchus or lung: Secondary | ICD-10-CM | POA: Diagnosis not present

## 2017-09-22 DIAGNOSIS — Z9221 Personal history of antineoplastic chemotherapy: Secondary | ICD-10-CM | POA: Diagnosis not present

## 2017-10-24 DIAGNOSIS — C3412 Malignant neoplasm of upper lobe, left bronchus or lung: Secondary | ICD-10-CM | POA: Diagnosis not present

## 2017-10-24 DIAGNOSIS — Z9221 Personal history of antineoplastic chemotherapy: Secondary | ICD-10-CM | POA: Diagnosis not present

## 2017-10-24 DIAGNOSIS — R930 Abnormal findings on diagnostic imaging of skull and head, not elsewhere classified: Secondary | ICD-10-CM | POA: Diagnosis not present

## 2017-10-24 DIAGNOSIS — Z85828 Personal history of other malignant neoplasm of skin: Secondary | ICD-10-CM | POA: Diagnosis not present

## 2017-10-24 DIAGNOSIS — F329 Major depressive disorder, single episode, unspecified: Secondary | ICD-10-CM | POA: Diagnosis not present

## 2017-10-24 DIAGNOSIS — R918 Other nonspecific abnormal finding of lung field: Secondary | ICD-10-CM | POA: Diagnosis not present

## 2017-10-24 DIAGNOSIS — Z8553 Personal history of malignant neoplasm of renal pelvis: Secondary | ICD-10-CM | POA: Diagnosis not present

## 2017-10-24 DIAGNOSIS — Z923 Personal history of irradiation: Secondary | ICD-10-CM | POA: Diagnosis not present

## 2017-10-24 DIAGNOSIS — E039 Hypothyroidism, unspecified: Secondary | ICD-10-CM | POA: Diagnosis not present

## 2017-10-24 DIAGNOSIS — Z821 Family history of blindness and visual loss: Secondary | ICD-10-CM | POA: Diagnosis not present

## 2017-12-08 DIAGNOSIS — C3412 Malignant neoplasm of upper lobe, left bronchus or lung: Secondary | ICD-10-CM | POA: Diagnosis not present

## 2017-12-08 DIAGNOSIS — Z85828 Personal history of other malignant neoplasm of skin: Secondary | ICD-10-CM

## 2017-12-08 DIAGNOSIS — N189 Chronic kidney disease, unspecified: Secondary | ICD-10-CM

## 2017-12-08 DIAGNOSIS — Z923 Personal history of irradiation: Secondary | ICD-10-CM | POA: Diagnosis not present

## 2017-12-08 DIAGNOSIS — R930 Abnormal findings on diagnostic imaging of skull and head, not elsewhere classified: Secondary | ICD-10-CM

## 2017-12-08 DIAGNOSIS — E039 Hypothyroidism, unspecified: Secondary | ICD-10-CM

## 2017-12-08 DIAGNOSIS — R001 Bradycardia, unspecified: Secondary | ICD-10-CM

## 2017-12-08 DIAGNOSIS — Z9221 Personal history of antineoplastic chemotherapy: Secondary | ICD-10-CM | POA: Diagnosis not present

## 2017-12-08 DIAGNOSIS — Z8553 Personal history of malignant neoplasm of renal pelvis: Secondary | ICD-10-CM

## 2017-12-08 DIAGNOSIS — F329 Major depressive disorder, single episode, unspecified: Secondary | ICD-10-CM

## 2017-12-08 DIAGNOSIS — R918 Other nonspecific abnormal finding of lung field: Secondary | ICD-10-CM

## 2017-12-08 DIAGNOSIS — Z821 Family history of blindness and visual loss: Secondary | ICD-10-CM

## 2018-01-12 DIAGNOSIS — Z821 Family history of blindness and visual loss: Secondary | ICD-10-CM

## 2018-01-12 DIAGNOSIS — N189 Chronic kidney disease, unspecified: Secondary | ICD-10-CM

## 2018-01-12 DIAGNOSIS — E039 Hypothyroidism, unspecified: Secondary | ICD-10-CM

## 2018-01-12 DIAGNOSIS — C3412 Malignant neoplasm of upper lobe, left bronchus or lung: Secondary | ICD-10-CM | POA: Diagnosis not present

## 2018-01-12 DIAGNOSIS — Z8553 Personal history of malignant neoplasm of renal pelvis: Secondary | ICD-10-CM

## 2018-01-12 DIAGNOSIS — R918 Other nonspecific abnormal finding of lung field: Secondary | ICD-10-CM

## 2018-01-12 DIAGNOSIS — Z85828 Personal history of other malignant neoplasm of skin: Secondary | ICD-10-CM

## 2018-01-12 DIAGNOSIS — Z79899 Other long term (current) drug therapy: Secondary | ICD-10-CM

## 2018-01-12 DIAGNOSIS — R001 Bradycardia, unspecified: Secondary | ICD-10-CM

## 2018-01-12 DIAGNOSIS — F329 Major depressive disorder, single episode, unspecified: Secondary | ICD-10-CM

## 2018-01-12 DIAGNOSIS — Z923 Personal history of irradiation: Secondary | ICD-10-CM | POA: Diagnosis not present

## 2018-01-12 DIAGNOSIS — Z8521 Personal history of malignant neoplasm of larynx: Secondary | ICD-10-CM

## 2018-01-12 DIAGNOSIS — Z9221 Personal history of antineoplastic chemotherapy: Secondary | ICD-10-CM | POA: Diagnosis not present

## 2018-01-27 DIAGNOSIS — E039 Hypothyroidism, unspecified: Secondary | ICD-10-CM | POA: Diagnosis not present

## 2018-01-27 DIAGNOSIS — Z821 Family history of blindness and visual loss: Secondary | ICD-10-CM

## 2018-01-27 DIAGNOSIS — R918 Other nonspecific abnormal finding of lung field: Secondary | ICD-10-CM

## 2018-01-27 DIAGNOSIS — Z923 Personal history of irradiation: Secondary | ICD-10-CM

## 2018-01-27 DIAGNOSIS — Z9221 Personal history of antineoplastic chemotherapy: Secondary | ICD-10-CM

## 2018-01-27 DIAGNOSIS — C3412 Malignant neoplasm of upper lobe, left bronchus or lung: Secondary | ICD-10-CM | POA: Diagnosis not present

## 2018-01-27 DIAGNOSIS — Z8521 Personal history of malignant neoplasm of larynx: Secondary | ICD-10-CM

## 2018-01-27 DIAGNOSIS — F329 Major depressive disorder, single episode, unspecified: Secondary | ICD-10-CM

## 2018-01-27 DIAGNOSIS — Z85828 Personal history of other malignant neoplasm of skin: Secondary | ICD-10-CM

## 2018-01-27 DIAGNOSIS — E876 Hypokalemia: Secondary | ICD-10-CM | POA: Diagnosis not present

## 2018-01-27 DIAGNOSIS — Z8553 Personal history of malignant neoplasm of renal pelvis: Secondary | ICD-10-CM

## 2018-01-27 DIAGNOSIS — E860 Dehydration: Secondary | ICD-10-CM

## 2018-02-02 DIAGNOSIS — E039 Hypothyroidism, unspecified: Secondary | ICD-10-CM | POA: Diagnosis not present

## 2018-02-02 DIAGNOSIS — Z923 Personal history of irradiation: Secondary | ICD-10-CM

## 2018-02-02 DIAGNOSIS — E876 Hypokalemia: Secondary | ICD-10-CM

## 2018-02-02 DIAGNOSIS — Z85828 Personal history of other malignant neoplasm of skin: Secondary | ICD-10-CM

## 2018-02-02 DIAGNOSIS — Z8553 Personal history of malignant neoplasm of renal pelvis: Secondary | ICD-10-CM

## 2018-02-02 DIAGNOSIS — F329 Major depressive disorder, single episode, unspecified: Secondary | ICD-10-CM | POA: Diagnosis not present

## 2018-02-02 DIAGNOSIS — Z85821 Personal history of Merkel cell carcinoma: Secondary | ICD-10-CM

## 2018-02-02 DIAGNOSIS — C3412 Malignant neoplasm of upper lobe, left bronchus or lung: Secondary | ICD-10-CM

## 2018-02-02 DIAGNOSIS — Z9221 Personal history of antineoplastic chemotherapy: Secondary | ICD-10-CM

## 2018-02-02 DIAGNOSIS — R918 Other nonspecific abnormal finding of lung field: Secondary | ICD-10-CM

## 2018-02-08 DIAGNOSIS — R627 Adult failure to thrive: Secondary | ICD-10-CM

## 2018-02-08 DIAGNOSIS — Z9181 History of falling: Secondary | ICD-10-CM

## 2018-02-08 DIAGNOSIS — J439 Emphysema, unspecified: Secondary | ICD-10-CM

## 2018-02-08 DIAGNOSIS — Z9221 Personal history of antineoplastic chemotherapy: Secondary | ICD-10-CM

## 2018-02-08 DIAGNOSIS — E860 Dehydration: Secondary | ICD-10-CM

## 2018-02-08 DIAGNOSIS — W19XXXA Unspecified fall, initial encounter: Secondary | ICD-10-CM

## 2018-02-08 DIAGNOSIS — R918 Other nonspecific abnormal finding of lung field: Secondary | ICD-10-CM

## 2018-02-08 DIAGNOSIS — R262 Difficulty in walking, not elsewhere classified: Secondary | ICD-10-CM

## 2018-02-08 DIAGNOSIS — C3412 Malignant neoplasm of upper lobe, left bronchus or lung: Secondary | ICD-10-CM | POA: Diagnosis not present

## 2018-02-08 DIAGNOSIS — C3490 Malignant neoplasm of unspecified part of unspecified bronchus or lung: Secondary | ICD-10-CM

## 2018-02-08 DIAGNOSIS — Z923 Personal history of irradiation: Secondary | ICD-10-CM

## 2018-02-08 DIAGNOSIS — I219 Acute myocardial infarction, unspecified: Secondary | ICD-10-CM

## 2018-02-08 DIAGNOSIS — J704 Drug-induced interstitial lung disorders, unspecified: Secondary | ICD-10-CM

## 2018-02-08 DIAGNOSIS — Z859 Personal history of malignant neoplasm, unspecified: Secondary | ICD-10-CM

## 2018-02-08 DIAGNOSIS — Z66 Do not resuscitate: Secondary | ICD-10-CM

## 2018-02-08 DIAGNOSIS — E876 Hypokalemia: Secondary | ICD-10-CM

## 2018-02-09 DIAGNOSIS — Z85828 Personal history of other malignant neoplasm of skin: Secondary | ICD-10-CM

## 2018-02-09 DIAGNOSIS — Z85821 Personal history of Merkel cell carcinoma: Secondary | ICD-10-CM

## 2018-02-09 DIAGNOSIS — Z859 Personal history of malignant neoplasm, unspecified: Secondary | ICD-10-CM

## 2018-02-09 DIAGNOSIS — Z9221 Personal history of antineoplastic chemotherapy: Secondary | ICD-10-CM

## 2018-02-09 DIAGNOSIS — Z8553 Personal history of malignant neoplasm of renal pelvis: Secondary | ICD-10-CM

## 2018-02-09 DIAGNOSIS — Z923 Personal history of irradiation: Secondary | ICD-10-CM

## 2018-02-09 DIAGNOSIS — C3412 Malignant neoplasm of upper lobe, left bronchus or lung: Secondary | ICD-10-CM

## 2018-02-09 DIAGNOSIS — F418 Other specified anxiety disorders: Secondary | ICD-10-CM

## 2018-02-09 DIAGNOSIS — Z79899 Other long term (current) drug therapy: Secondary | ICD-10-CM

## 2018-03-24 DEATH — deceased
